# Patient Record
Sex: Female | Born: 1950
Health system: Southern US, Community
[De-identification: ages and names within clinical notes are randomized; demographics above are authoritative.]

## PROBLEM LIST (undated history)

## (undated) DIAGNOSIS — C801 Malignant (primary) neoplasm, unspecified: Secondary | ICD-10-CM

## (undated) DIAGNOSIS — E039 Hypothyroidism, unspecified: Secondary | ICD-10-CM

## (undated) DIAGNOSIS — I471 Supraventricular tachycardia, unspecified: Secondary | ICD-10-CM

## (undated) DIAGNOSIS — K649 Unspecified hemorrhoids: Secondary | ICD-10-CM

## (undated) DIAGNOSIS — C50919 Malignant neoplasm of unspecified site of unspecified female breast: Secondary | ICD-10-CM

## (undated) DIAGNOSIS — E785 Hyperlipidemia, unspecified: Secondary | ICD-10-CM

## (undated) DIAGNOSIS — M8580 Other specified disorders of bone density and structure, unspecified site: Secondary | ICD-10-CM

## (undated) DIAGNOSIS — Z1371 Encounter for nonprocreative screening for genetic disease carrier status: Secondary | ICD-10-CM

## (undated) DIAGNOSIS — Z8669 Personal history of other diseases of the nervous system and sense organs: Secondary | ICD-10-CM

## (undated) DIAGNOSIS — Z87891 Personal history of nicotine dependence: Secondary | ICD-10-CM

## (undated) DIAGNOSIS — R002 Palpitations: Secondary | ICD-10-CM

## (undated) DIAGNOSIS — C4490 Unspecified malignant neoplasm of skin, unspecified: Secondary | ICD-10-CM

## (undated) DIAGNOSIS — R232 Flushing: Secondary | ICD-10-CM

## (undated) DIAGNOSIS — I10 Essential (primary) hypertension: Secondary | ICD-10-CM

## (undated) DIAGNOSIS — D0511 Intraductal carcinoma in situ of right breast: Secondary | ICD-10-CM

## (undated) DIAGNOSIS — Z87440 Personal history of urinary (tract) infections: Secondary | ICD-10-CM

## (undated) HISTORY — DX: Intraductal carcinoma in situ of right breast: D05.11

## (undated) HISTORY — DX: Hypothyroidism, unspecified: E03.9

## (undated) HISTORY — DX: Personal history of urinary (tract) infections: Z87.440

## (undated) HISTORY — DX: Supraventricular tachycardia, unspecified: I47.10

## (undated) HISTORY — DX: Palpitations: R00.2

## (undated) HISTORY — DX: Malignant (primary) neoplasm, unspecified: C80.1

## (undated) HISTORY — DX: Personal history of other diseases of the nervous system and sense organs: Z86.69

## (undated) HISTORY — DX: Malignant neoplasm of unspecified site of unspecified female breast: C50.919

## (undated) HISTORY — DX: Unspecified hemorrhoids: K64.9

## (undated) HISTORY — DX: Hyperlipidemia, unspecified: E78.5

## (undated) HISTORY — DX: Other specified disorders of bone density and structure, unspecified site: M85.80

## (undated) HISTORY — DX: Personal history of nicotine dependence: Z87.891

## (undated) HISTORY — DX: Supraventricular tachycardia: I47.1

## (undated) HISTORY — DX: Encounter for nonprocreative screening for genetic disease carrier status: Z13.71

## (undated) HISTORY — DX: Essential (primary) hypertension: I10

## (undated) HISTORY — DX: Flushing: R23.2

## (undated) HISTORY — DX: Unspecified malignant neoplasm of skin, unspecified: C44.90

---

## 2004-08-07 DIAGNOSIS — C801 Malignant (primary) neoplasm, unspecified: Secondary | ICD-10-CM

## 2004-08-07 HISTORY — PX: MASTECTOMY: SHX3

## 2004-08-07 HISTORY — DX: Malignant (primary) neoplasm, unspecified: C80.1

## 2004-08-07 HISTORY — PX: BREAST BIOPSY: SHX20

## 2005-07-07 ENCOUNTER — Ambulatory Visit: Payer: Self-pay | Admitting: Oncology

## 2005-07-14 ENCOUNTER — Ambulatory Visit: Payer: Self-pay | Admitting: Oncology

## 2005-07-18 ENCOUNTER — Ambulatory Visit: Payer: Self-pay | Admitting: General Surgery

## 2005-08-03 ENCOUNTER — Ambulatory Visit: Payer: Self-pay | Admitting: General Surgery

## 2005-08-07 ENCOUNTER — Ambulatory Visit: Payer: Self-pay | Admitting: Oncology

## 2005-08-07 HISTORY — PX: COLONOSCOPY: SHX174

## 2005-09-07 ENCOUNTER — Ambulatory Visit: Payer: Self-pay | Admitting: Oncology

## 2005-10-05 ENCOUNTER — Ambulatory Visit: Payer: Self-pay | Admitting: Oncology

## 2005-11-05 ENCOUNTER — Ambulatory Visit: Payer: Self-pay | Admitting: Oncology

## 2005-12-05 ENCOUNTER — Ambulatory Visit: Payer: Self-pay | Admitting: Oncology

## 2006-01-05 ENCOUNTER — Ambulatory Visit: Payer: Self-pay | Admitting: Oncology

## 2006-02-04 ENCOUNTER — Ambulatory Visit: Payer: Self-pay | Admitting: Oncology

## 2006-03-07 ENCOUNTER — Ambulatory Visit: Payer: Self-pay | Admitting: Oncology

## 2006-04-07 ENCOUNTER — Ambulatory Visit: Payer: Self-pay | Admitting: Oncology

## 2006-04-30 ENCOUNTER — Other Ambulatory Visit: Payer: Self-pay

## 2006-04-30 ENCOUNTER — Emergency Department: Payer: Self-pay | Admitting: Emergency Medicine

## 2006-05-07 ENCOUNTER — Ambulatory Visit: Payer: Self-pay | Admitting: Oncology

## 2006-06-07 ENCOUNTER — Ambulatory Visit: Payer: Self-pay | Admitting: Oncology

## 2006-06-13 ENCOUNTER — Ambulatory Visit: Payer: Self-pay | Admitting: General Surgery

## 2006-07-07 ENCOUNTER — Ambulatory Visit: Payer: Self-pay | Admitting: Oncology

## 2006-08-07 ENCOUNTER — Ambulatory Visit: Payer: Self-pay | Admitting: Oncology

## 2006-09-07 ENCOUNTER — Ambulatory Visit: Payer: Self-pay | Admitting: Oncology

## 2006-09-11 ENCOUNTER — Emergency Department: Payer: Self-pay | Admitting: General Practice

## 2006-09-11 ENCOUNTER — Other Ambulatory Visit: Payer: Self-pay

## 2006-10-06 ENCOUNTER — Ambulatory Visit: Payer: Self-pay | Admitting: Oncology

## 2006-11-09 ENCOUNTER — Ambulatory Visit: Payer: Self-pay | Admitting: Oncology

## 2006-12-06 ENCOUNTER — Ambulatory Visit: Payer: Self-pay | Admitting: Oncology

## 2007-01-06 ENCOUNTER — Ambulatory Visit: Payer: Self-pay | Admitting: Oncology

## 2007-03-08 ENCOUNTER — Ambulatory Visit: Payer: Self-pay | Admitting: Oncology

## 2007-03-28 ENCOUNTER — Ambulatory Visit: Payer: Self-pay | Admitting: Oncology

## 2007-04-08 ENCOUNTER — Ambulatory Visit: Payer: Self-pay | Admitting: Oncology

## 2007-05-08 ENCOUNTER — Ambulatory Visit: Payer: Self-pay | Admitting: Oncology

## 2007-06-18 ENCOUNTER — Ambulatory Visit: Payer: Self-pay | Admitting: Oncology

## 2007-07-08 ENCOUNTER — Ambulatory Visit: Payer: Self-pay | Admitting: Oncology

## 2007-08-08 ENCOUNTER — Ambulatory Visit: Payer: Self-pay | Admitting: Oncology

## 2007-08-23 ENCOUNTER — Ambulatory Visit: Payer: Self-pay | Admitting: Oncology

## 2007-08-29 ENCOUNTER — Ambulatory Visit: Payer: Self-pay | Admitting: Gastroenterology

## 2007-09-08 ENCOUNTER — Ambulatory Visit: Payer: Self-pay | Admitting: Oncology

## 2007-10-06 ENCOUNTER — Ambulatory Visit: Payer: Self-pay | Admitting: Oncology

## 2007-10-22 ENCOUNTER — Ambulatory Visit: Payer: Self-pay | Admitting: Oncology

## 2007-11-06 ENCOUNTER — Ambulatory Visit: Payer: Self-pay | Admitting: Oncology

## 2007-12-06 ENCOUNTER — Ambulatory Visit: Payer: Self-pay | Admitting: Oncology

## 2008-01-06 ENCOUNTER — Ambulatory Visit: Payer: Self-pay | Admitting: Oncology

## 2008-01-07 ENCOUNTER — Ambulatory Visit: Payer: Self-pay | Admitting: Internal Medicine

## 2008-04-07 ENCOUNTER — Ambulatory Visit: Payer: Self-pay | Admitting: Oncology

## 2008-04-27 ENCOUNTER — Ambulatory Visit: Payer: Self-pay | Admitting: Oncology

## 2008-05-07 ENCOUNTER — Ambulatory Visit: Payer: Self-pay | Admitting: Oncology

## 2008-06-22 ENCOUNTER — Ambulatory Visit: Payer: Self-pay | Admitting: Oncology

## 2008-08-07 ENCOUNTER — Ambulatory Visit: Payer: Self-pay | Admitting: Oncology

## 2008-08-24 ENCOUNTER — Ambulatory Visit: Payer: Self-pay | Admitting: Oncology

## 2008-09-07 ENCOUNTER — Ambulatory Visit: Payer: Self-pay | Admitting: Oncology

## 2009-02-04 ENCOUNTER — Ambulatory Visit: Payer: Self-pay | Admitting: Oncology

## 2009-02-22 ENCOUNTER — Ambulatory Visit: Payer: Self-pay | Admitting: Oncology

## 2009-03-07 ENCOUNTER — Ambulatory Visit: Payer: Self-pay | Admitting: Oncology

## 2009-06-23 ENCOUNTER — Ambulatory Visit: Payer: Self-pay | Admitting: General Surgery

## 2009-08-07 ENCOUNTER — Ambulatory Visit: Payer: Self-pay | Admitting: Oncology

## 2009-08-25 ENCOUNTER — Ambulatory Visit: Payer: Self-pay | Admitting: Oncology

## 2009-09-07 ENCOUNTER — Ambulatory Visit: Payer: Self-pay | Admitting: Oncology

## 2010-02-04 ENCOUNTER — Ambulatory Visit: Payer: Self-pay | Admitting: Oncology

## 2010-02-23 ENCOUNTER — Ambulatory Visit: Payer: Self-pay | Admitting: Oncology

## 2010-03-07 ENCOUNTER — Ambulatory Visit: Payer: Self-pay | Admitting: Oncology

## 2010-06-28 ENCOUNTER — Ambulatory Visit: Payer: Self-pay | Admitting: Oncology

## 2010-08-26 ENCOUNTER — Ambulatory Visit: Payer: Self-pay | Admitting: Oncology

## 2010-09-07 ENCOUNTER — Ambulatory Visit: Payer: Self-pay | Admitting: Oncology

## 2011-07-04 ENCOUNTER — Ambulatory Visit: Payer: Self-pay | Admitting: Oncology

## 2011-07-27 ENCOUNTER — Ambulatory Visit: Payer: Self-pay | Admitting: Oncology

## 2011-08-08 ENCOUNTER — Ambulatory Visit: Payer: Self-pay | Admitting: Oncology

## 2012-07-08 ENCOUNTER — Ambulatory Visit: Payer: Self-pay | Admitting: Oncology

## 2012-07-25 ENCOUNTER — Ambulatory Visit: Payer: Self-pay | Admitting: Oncology

## 2012-08-07 ENCOUNTER — Ambulatory Visit: Payer: Self-pay | Admitting: Oncology

## 2012-11-26 ENCOUNTER — Ambulatory Visit: Payer: Self-pay | Admitting: Cardiovascular Disease

## 2012-12-06 ENCOUNTER — Ambulatory Visit: Payer: Self-pay | Admitting: Cardiovascular Disease

## 2012-12-31 ENCOUNTER — Encounter: Payer: Self-pay | Admitting: Cardiovascular Disease

## 2012-12-31 ENCOUNTER — Ambulatory Visit (INDEPENDENT_AMBULATORY_CARE_PROVIDER_SITE_OTHER): Payer: BC Managed Care – PPO | Admitting: Cardiovascular Disease

## 2012-12-31 VITALS — BP 148/92 | HR 61 | Ht 68.0 in | Wt 169.0 lb

## 2012-12-31 DIAGNOSIS — F411 Generalized anxiety disorder: Secondary | ICD-10-CM

## 2012-12-31 DIAGNOSIS — F39 Unspecified mood [affective] disorder: Secondary | ICD-10-CM | POA: Insufficient documentation

## 2012-12-31 DIAGNOSIS — I471 Supraventricular tachycardia, unspecified: Secondary | ICD-10-CM

## 2012-12-31 DIAGNOSIS — E039 Hypothyroidism, unspecified: Secondary | ICD-10-CM

## 2012-12-31 DIAGNOSIS — E785 Hyperlipidemia, unspecified: Secondary | ICD-10-CM

## 2012-12-31 DIAGNOSIS — R002 Palpitations: Secondary | ICD-10-CM

## 2012-12-31 DIAGNOSIS — F419 Anxiety disorder, unspecified: Secondary | ICD-10-CM

## 2012-12-31 HISTORY — DX: Supraventricular tachycardia, unspecified: I47.10

## 2012-12-31 NOTE — Addendum Note (Signed)
Addended by: Antonieta Iba on: 12/31/2012 05:57 PM   Modules accepted: Level of Service

## 2012-12-31 NOTE — Progress Notes (Signed)
   Patient ID: Adriana Cobb, female    DOB: 1951/06/24, 62 y.o.   MRN: [id number]  HPI Comments: Adriana Cobb is a very pleasant 62 year old woman with history of breast cancer on the left, mastectomy, chemotherapy, radiation treatments in 2006, history of SVT with 2 episodes in 2008 treated with adenosine, initially started on amiodarone and changed to sotalol. She presents to establish care in our office. Also has a history of hyperlipidemia.  Months overall, she has had no further episodes of SVT since 2008. She is nervous about coming off the medication. She is active, no chest pain, no shortness of breath with activity. Overall has no complaints.   No recent lab work. Prior cholesterol 2008 shows total cholesterol 241, LDL 158, prior to starting simvastatin  Studies done at [facility] Echocardiogram in 2008 was essentially normal with mild MR Echocardiogram May 2009 documented moderate pulmonic regurgitation and moderate MR Echocardiogram November 2009 showed mild to moderate MR Stress test June 2000 and shows no ischemia, she exercised on treadmill for 8 minutes 30 seconds achieved 10 METS  EKG today shows normal sinus rhythm with rate 61 beats per minute, no significant ST or T wave changes       BP 148/92  Pulse 61  Ht 5\' 8"  (1.727 m)  Wt 169 lb (76.658 kg)  BMI 25.7 kg/m2  Review of Systems  Constitutional: Negative.   HENT: Negative.   Eyes: Negative.   Respiratory: Negative.   Cardiovascular: Negative.   Gastrointestinal: Negative.   Musculoskeletal: Negative.   Skin: Negative.   Neurological: Negative.   Psychiatric/Behavioral: The patient is nervous/anxious.   All other systems reviewed and are negative.    BP 148/92  Pulse 61  Ht 5\' 8"  (1.727 m)  Wt 169 lb (76.658 kg)  BMI 25.7 kg/m2  Physical Exam  Nursing note and vitals reviewed. Constitutional: She is oriented to person, place, and time. She appears well-developed and well-nourished.  HENT:   Head: Normocephalic.  Nose: Nose normal.  Mouth/Throat: Oropharynx is clear and moist.  Eyes: Conjunctivae are normal. Pupils are equal, round, and reactive to light.  Neck: Normal range of motion. Neck supple. No JVD present.  Cardiovascular: Normal rate, regular rhythm, S1 normal, S2 normal, normal heart sounds and intact distal pulses.  Exam reveals no gallop and no friction rub.   No murmur heard. Pulmonary/Chest: Effort normal and breath sounds normal. No respiratory distress. She has no wheezes. She has no rales. She exhibits no tenderness.  Abdominal: Soft. Bowel sounds are normal. She exhibits no distension. There is no tenderness.  Musculoskeletal: Normal range of motion. She exhibits no edema and no tenderness.  Lymphadenopathy:    She has no cervical adenopathy.  Neurological: She is alert and oriented to person, place, and time. Coordination normal.  Skin: Skin is warm and dry. No rash noted. No erythema.  Psychiatric: She has a normal mood and affect. Her behavior is normal. Judgment and thought content normal.    Assessment and Plan

## 2012-12-31 NOTE — Assessment & Plan Note (Signed)
We'll try to obtain most recent lipid panel from her primary care physician

## 2012-12-31 NOTE — Patient Instructions (Addendum)
You are doing well. No medication changes were made.  Ok to wean off sotolol if you would like for SVT Call the office for episodes of tachycardia  Please call us if you have new issues that need to be addressed before your next appt.  Your physician wants you to follow-up in: 12 months.  You will receive a reminder letter in the mail two months in advance. If you don't receive a letter, please call our office to schedule the follow-up appointment.

## 2012-12-31 NOTE — Assessment & Plan Note (Signed)
No further episodes since 2008. She is uncertain whether she would like to wean off sotalol. She feels that she would be more anxious without medication. We will leave this up to her whether she would like to wean the medication slowly, decreasing the dose to 40 mg twice a day or stay on the medication. No further testing needed at this time. We did discuss ablation. Also discussed the need for adenosine if she has additional episodes.

## 2013-01-31 ENCOUNTER — Encounter: Payer: Self-pay | Admitting: *Deleted

## 2013-01-31 DIAGNOSIS — C801 Malignant (primary) neoplasm, unspecified: Secondary | ICD-10-CM | POA: Insufficient documentation

## 2013-05-21 ENCOUNTER — Other Ambulatory Visit: Payer: Self-pay | Admitting: *Deleted

## 2013-05-21 MED ORDER — SOTALOL HCL (AF) 80 MG PO TABS: 80.0000 mg | ORAL_TABLET | Freq: Two times a day (BID) | ORAL | Status: DC

## 2013-05-21 NOTE — Telephone Encounter (Signed)
Requested Prescriptions   Signed Prescriptions Disp Refills  . SOTALOL AF 80 MG TABS 60 each 3    Sig: Take 1 tablet (80 mg total) by mouth 2 (two) times daily.    Authorizing Provider: Antonieta Iba    Ordering User: Kendrick Fries

## 2013-07-09 ENCOUNTER — Ambulatory Visit: Payer: Self-pay | Admitting: Oncology

## 2013-07-18 ENCOUNTER — Encounter: Payer: Self-pay | Admitting: General Surgery

## 2013-07-18 ENCOUNTER — Ambulatory Visit: Payer: Self-pay | Admitting: Oncology

## 2013-07-18 ENCOUNTER — Ambulatory Visit (INDEPENDENT_AMBULATORY_CARE_PROVIDER_SITE_OTHER): Payer: BC Managed Care – PPO | Admitting: General Surgery

## 2013-07-18 VITALS — BP 128/78 | HR 76 | Resp 12 | Ht 68.0 in | Wt 170.0 lb

## 2013-07-18 DIAGNOSIS — Z853 Personal history of malignant neoplasm of breast: Secondary | ICD-10-CM

## 2013-07-18 DIAGNOSIS — C801 Malignant (primary) neoplasm, unspecified: Secondary | ICD-10-CM

## 2013-07-18 DIAGNOSIS — R928 Other abnormal and inconclusive findings on diagnostic imaging of breast: Secondary | ICD-10-CM

## 2013-07-18 NOTE — Patient Instructions (Signed)
Stereotactic Breast Biopsy   A stereotactic breast biopsy is a procedure in which mammography is used in the collection of a sample of breast tissue. Mammography is a type of X-ray exam of the breasts that produces an image called a mammogram. The mammogram allows your health care provider to precisely locate the area of the breast from which a tissue sample will be taken. The tissue is then examined under a microscope to see if cancerous cells are present. A breast biopsy is done when:   · A lump, abnormality, or mass is seen in the breast on a breast X-ray (mammogram).    · Small calcium deposits (calcifications) are seen in the breast.    · The shape or appearance of the breasts changes.    · The shape or appearance of the nipples changes. You may have unusual or bloody discharge coming from the nipples, or you may have crusting, retraction, or dimpling of the nipples.  A breast biopsy can indicate if you need surgery or other treatment.    LET YOUR HEALTH CARE PROVIDER KNOW ABOUT:  · Any allergies you have.  · All medicines you are taking, including vitamins, herbs, eye drops, creams, and over-the-counter medicines.  · Previous problems you or members of your family have had with the use of anesthetics.  · Any blood disorders you have.  · Previous surgeries you have had.  · Medical conditions you have.  RISKS AND COMPLICATIONS  Generally, stereotactic breast biopsy is a safe procedure. However, as with any procedure, complications can occur. Possible complications include:  · Infection at the needle-insertion site.    · Bleeding or bruising after surgery.  · The breast may become altered or deformed as a result of the procedure.  · The needle may go through the chest wall into the lung area.    BEFORE THE PROCEDURE  · Wear a supportive bra to the procedure.  · You will be asked to remove jewelry, dentures, eyeglasses, metal objects, or clothing that might interfere with the X-ray images. You may want to leave  some of these objects at home.  · Arrange for someone to drive you home after the procedure if desired.  PROCEDURE   A stereotactic breast biopsy is done while you are awake. During the procedure, relax as much as possible. Let your health care provider know if you are uncomfortable, anxious, or in pain. Usually, the only discomfort felt during the procedure is caused by staying in one position for the length of the procedure. This discomfort can be reduced by carefully placed cushions.  Most of the time the biopsy is done using a table with openings on it. You will be asked to lie facedown on the table and place your breasts through the openings. Your breast is compressed between metal plates to get good X-ray images. Your skin will be cleaned, and a numbing medicine (local anesthetic) will be injected. A small cut (incision) will be made in your breast. The tip of the biopsy needle will be directed through the incision. Several small pieces of suspicious tissue will be taken. Then, a final set of X-ray images will be obtained. If they show that the suspicious tissue has been mostly or completely removed, a small clip will be left at the biopsy site. This is done so that the biopsy site can be easily located if the results of the biopsy show that the tissue is cancerous.   After the procedure, the incision will be stitched (sutured) or taped and covered   with a bandage (dressing). Your health care provider may apply a pressure dressing and an ice pack to prevent bleeding and swelling in the breast.   A stereotactic breast biopsy can take 30 minutes or more.  AFTER THE PROCEDURE   If you are doing well and have no problems, you will be allowed to go home.   Document Released: 04/22/2003 Document Revised: 03/26/2013 Document Reviewed: 02/20/2013  Exit[employer]® Patient Information ©2014 [employer].

## 2013-07-18 NOTE — Progress Notes (Signed)
Patient ID: Adriana Cobb, female   DOB: 10/01/1950, 62 y.o.   MRN: [id number]  Chief Complaint  Patient presents with  . Follow-up    mammogram    HPI Adriana Cobb is a 62 y.o. female  who presents for a breast evaluation. The most recent mammogram was done at [facility] on 07/09/13 with added views done on 07/18/13.  Patient does perform regular self breast checks and gets regular mammograms done. No complaints. Patient has a history of Left breast cancer that was treated with chemotherapy and radiation, and a left modified radical mastectomy in 2007.   HPI  Past Medical History  Diagnosis Date  . Unspecified essential hypertension   . Unspecified hypothyroidism   . Hyperlipidemia   . Palpitations   . Paroxysmal supraventricular tachycardia   . History of breast cancer     left breast   . Personal history of tobacco use, presenting hazards to health   . H/O cystitis   . Cancer 2006    left breast, treated with Radiation and Chemo  . Hemorrhoids     Past Surgical History  Procedure Laterality Date  . Mastectomy Left 2006    left breast  . Breast biopsy Left 2006    stereo breast bx  . Colonoscopy  2007    Dr. Servando Snare  . Cesarean section  1987, 1988    x 2    Family History  Problem Relation Age of Onset  . Cancer Other     breast cancer    Social History History  Substance Use Topics  . Smoking status: Former Smoker -- 1.00 packs/day for 10 years    Types: Cigarettes    Quit date: 09/18/1978  . Smokeless tobacco: Not on file  . Alcohol Use: Yes     Comment: bottle a wine weekly    Allergies  Allergen Reactions  . Enalapril     Cough     Current Outpatient Prescriptions  Medication Sig Dispense Refill  . citalopram (CELEXA) 20 MG tablet Take 20 mg by mouth daily.      Marland Kitchen levothyroxine (SYNTHROID, LEVOTHROID) 50 MCG tablet Take 50 mcg by mouth daily before breakfast.      . simvastatin (ZOCOR) 40 MG tablet Take 40 mg by mouth every evening.      Marland Kitchen  SOTALOL AF 80 MG TABS Take 1 tablet (80 mg total) by mouth 2 (two) times daily.  180 each  3   No current facility-administered medications for this visit.    Review of Systems Review of Systems  Constitutional: Negative.   Respiratory: Negative.   Cardiovascular: Negative.     Blood pressure 128/78, pulse 76, resp. rate 12, height 5\' 8"  (1.727 m), weight 170 lb (77.111 kg).  Physical Exam Physical Exam  Constitutional: She is oriented to person, place, and time. She appears well-developed and well-nourished.  Eyes: No scleral icterus.  Neck: No mass and no thyromegaly present.  Cardiovascular: Normal rate, regular rhythm and normal heart sounds.   Pulmonary/Chest: Breath sounds normal. Right breast exhibits no inverted nipple, no mass, no nipple discharge, no skin change and no tenderness.  Left Mastectomy site well healed, incision looks clean. No signs of local recurrence.  Abdominal: Soft. Bowel sounds are normal. There is no tenderness.  Lymphadenopathy:    She has no cervical adenopathy.    She has no axillary adenopathy.  Neurological: She is alert and oriented to person, place, and time.  Skin: Skin is warm and  dry.     Assessment    Recent right mammogram shows a cluster of microcalcifications -needs stereotactic biopsy. Pt is familiar with this procedure and is agreeable. Exam otherwise is stable.    Plan    Patient to have a right breast stereo biopsy.       Andjela Wickes G 07/18/2013, 1:35 PM

## 2013-07-21 ENCOUNTER — Other Ambulatory Visit: Payer: Self-pay | Admitting: *Deleted

## 2013-07-21 DIAGNOSIS — R920 Mammographic microcalcification found on diagnostic imaging of breast: Secondary | ICD-10-CM

## 2013-07-21 DIAGNOSIS — Z853 Personal history of malignant neoplasm of breast: Secondary | ICD-10-CM

## 2013-07-21 NOTE — Progress Notes (Signed)
Patient has been scheduled for a right breast stereotactic biopsy at [facility] for 08-11-13 at 1 pm. She will check-in at the [facility] at 12:30 pm. This patient is aware of date, time, and instructions. Patient verbalizes understanding.

## 2013-07-22 ENCOUNTER — Encounter: Payer: Self-pay | Admitting: *Deleted

## 2013-07-24 ENCOUNTER — Ambulatory Visit: Payer: Self-pay | Admitting: General Surgery

## 2013-07-29 ENCOUNTER — Ambulatory Visit: Payer: Self-pay | Admitting: Oncology

## 2013-08-07 ENCOUNTER — Ambulatory Visit: Payer: Self-pay | Admitting: Oncology

## 2013-08-07 HISTORY — PX: MASTECTOMY: SHX3

## 2013-08-11 ENCOUNTER — Ambulatory Visit: Payer: Self-pay | Admitting: General Surgery

## 2013-08-11 DIAGNOSIS — R920 Mammographic microcalcification found on diagnostic imaging of breast: Secondary | ICD-10-CM

## 2013-08-13 ENCOUNTER — Encounter: Payer: Self-pay | Admitting: General Surgery

## 2013-08-13 ENCOUNTER — Telehealth: Payer: Self-pay | Admitting: General Surgery

## 2013-08-13 NOTE — Telephone Encounter (Signed)
Patient's surgery has been scheduled for 08-25-13 at [facility]. We will review all instructions with this patient at office visit per her request.   Dr. Jamal Collin will need to complete orders and update history and physical for pre-admit appointment scheduled for 08-20-13 at 3 pm.

## 2013-08-13 NOTE — Telephone Encounter (Signed)
Path report from right breast stereotactic biopsy-DCIS. Pt advised on this. Pt is familiar with aspects of breast cancer treatment-having had left mastectomy already.. She is very much inclined to have a right mastectomy.  I feel this is reasonable. Pt also is not interested in reconstruction. Will plan for right total mastectomy, sn biopsy.

## 2013-08-15 ENCOUNTER — Encounter: Payer: Self-pay | Admitting: General Surgery

## 2013-08-18 ENCOUNTER — Encounter: Payer: Self-pay | Admitting: General Surgery

## 2013-08-18 ENCOUNTER — Ambulatory Visit (INDEPENDENT_AMBULATORY_CARE_PROVIDER_SITE_OTHER): Payer: BC Managed Care – PPO | Admitting: General Surgery

## 2013-08-18 VITALS — BP 132/78 | HR 72 | Resp 14 | Ht 68.0 in | Wt 170.0 lb

## 2013-08-18 DIAGNOSIS — D0590 Unspecified type of carcinoma in situ of unspecified breast: Secondary | ICD-10-CM

## 2013-08-18 DIAGNOSIS — D0511 Intraductal carcinoma in situ of right breast: Secondary | ICD-10-CM

## 2013-08-18 NOTE — Progress Notes (Signed)
Patient ID: Adriana Cobb, female   DOB: 1951-03-01, 62 y.o.   MRN: [id number]  Chief Complaint  Patient presents with  . Pre-op Exam    right mastectomy    HPI Adriana Cobb is a 63 y.o. female. Here for pre op right mastectomy with sentinel node biopsy scheduled for 08/25/13. She reports the biopsy site is healing well. She reports no changes since last seen. Path report on stereo biopsy showed DCIS. Pt was advised by phone earlier and she decided to have mastectomy. She is not interested in reconstruction. ER/PR will be done on excised specimen.  HPI  Past Medical History  Diagnosis Date  . Unspecified essential hypertension   . Unspecified hypothyroidism   . Hyperlipidemia   . Palpitations   . Paroxysmal supraventricular tachycardia   . History of breast cancer     left breast   . Personal history of tobacco use, presenting hazards to health   . H/O cystitis   . Cancer 2006    left breast, treated with Radiation and Chemo  . Hemorrhoids     Past Surgical History  Procedure Laterality Date  . Mastectomy Left 2006    left breast  . Breast biopsy Left 2006    stereo breast bx  . Colonoscopy  2007    Dr. Allen Norris  . Cesarean section  1987, 1988    x 2    Family History  Problem Relation Age of Onset  . Cancer Other     breast cancer    Social History History  Substance Use Topics  . Smoking status: Former Smoker -- 1.00 packs/day for 10 years    Types: Cigarettes    Quit date: 09/18/1978  . Smokeless tobacco: Not on file  . Alcohol Use: Yes     Comment: bottle a wine weekly    Allergies  Allergen Reactions  . Enalapril     Cough     Current Outpatient Prescriptions  Medication Sig Dispense Refill  . citalopram (CELEXA) 20 MG tablet Take 20 mg by mouth daily.      Marland Kitchen levothyroxine (SYNTHROID, LEVOTHROID) 50 MCG tablet Take 50 mcg by mouth daily before breakfast.      . simvastatin (ZOCOR) 40 MG tablet Take 40 mg by mouth every evening.      Marland Kitchen SOTALOL  AF 80 MG TABS Take 1 tablet (80 mg total) by mouth 2 (two) times daily.  180 each  3   No current facility-administered medications for this visit.    Review of Systems Review of Systems  Constitutional: Negative.   Respiratory: Negative.   Cardiovascular: Negative.     Blood pressure 132/78, pulse 72, resp. rate 14, height 5\' 8"  (1.727 m), weight 170 lb (77.111 kg).  Physical Exam Physical Exam  Constitutional: She is oriented to person, place, and time. She appears well-developed and well-nourished.  Neck: Neck supple. No tracheal deviation present. No thyromegaly present.  Cardiovascular: Normal rate, regular rhythm and normal heart sounds.   Pulmonary/Chest: Effort normal and breath sounds normal.  Left mastectomy site well healed, no sign of local recurrence. Right breast biopsy site is healing well.  Abdominal: Soft. There is no hepatosplenomegaly. There is no tenderness.  Lymphadenopathy:    She has no cervical adenopathy.    She has no axillary adenopathy.  Neurological: She is alert and oriented to person, place, and time.    Data Reviewed Path report.  Assessment    DCIS right breast, prior left mastectomy  for stage 2 CA.      Plan Explained mastectomy procedure. Also plan for SN biopsy in case there is invasive component in the excised breast. Pt is agreeable.             SANKAR,SEEPLAPUTHUR G 08/19/2013, 9:18 AM

## 2013-08-19 ENCOUNTER — Encounter: Payer: Self-pay | Admitting: General Surgery

## 2013-08-20 ENCOUNTER — Ambulatory Visit: Payer: Self-pay | Admitting: General Surgery

## 2013-08-25 ENCOUNTER — Ambulatory Visit: Payer: Self-pay | Admitting: General Surgery

## 2013-08-25 ENCOUNTER — Encounter: Payer: Self-pay | Admitting: General Surgery

## 2013-08-25 DIAGNOSIS — D0590 Unspecified type of carcinoma in situ of unspecified breast: Secondary | ICD-10-CM

## 2013-08-26 ENCOUNTER — Encounter: Payer: Self-pay | Admitting: General Surgery

## 2013-08-28 ENCOUNTER — Encounter: Payer: Self-pay | Admitting: General Surgery

## 2013-08-28 ENCOUNTER — Ambulatory Visit: Payer: BC Managed Care – PPO | Admitting: *Deleted

## 2013-08-28 DIAGNOSIS — Z853 Personal history of malignant neoplasm of breast: Secondary | ICD-10-CM

## 2013-08-28 DIAGNOSIS — C50919 Malignant neoplasm of unspecified site of unspecified female breast: Secondary | ICD-10-CM

## 2013-08-28 LAB — PATHOLOGY REPORT

## 2013-08-28 NOTE — Progress Notes (Signed)
She is post op right mastectomy here for a wound check. Wound clean no signs of infection. Aware she may use heating pad for comfort and may use Aleve BiD. Drainage average 40-50 daily.

## 2013-08-28 NOTE — Patient Instructions (Signed)
Follow up as scheduled.  Continue JP drain record

## 2013-09-01 LAB — PATHOLOGY REPORT

## 2013-09-02 ENCOUNTER — Encounter: Payer: Self-pay | Admitting: General Surgery

## 2013-09-04 ENCOUNTER — Encounter: Payer: Self-pay | Admitting: General Surgery

## 2013-09-04 ENCOUNTER — Ambulatory Visit (INDEPENDENT_AMBULATORY_CARE_PROVIDER_SITE_OTHER): Payer: BC Managed Care – PPO | Admitting: General Surgery

## 2013-09-04 VITALS — BP 118/72 | HR 78 | Resp 12 | Ht 68.0 in | Wt 171.0 lb

## 2013-09-04 DIAGNOSIS — D0510 Intraductal carcinoma in situ of unspecified breast: Secondary | ICD-10-CM | POA: Insufficient documentation

## 2013-09-04 DIAGNOSIS — Z853 Personal history of malignant neoplasm of breast: Secondary | ICD-10-CM

## 2013-09-04 DIAGNOSIS — D0590 Unspecified type of carcinoma in situ of unspecified breast: Secondary | ICD-10-CM

## 2013-09-04 HISTORY — DX: Intraductal carcinoma in situ of unspecified breast: D05.10

## 2013-09-04 NOTE — Progress Notes (Signed)
This is a 63 year old female here today for her post op right breast mastectomy done on 08/25/13. Patient states she [place] doing well. She reports that she had about 13cc from her drain yesterday. Today she reports about 0.5 cc total drainage today. She says the pain is much better.   Drainage has been less than 30cc for 2 days. Drain was removed. Incision is clean and healing well, no seroma identified. Pathology DCIS, SN negative. ER/PR both positive. Patient advised.  Follow up in two weeks.

## 2013-09-04 NOTE — Patient Instructions (Addendum)
Tamoxifen oral solution What is this medicine? TAMOXIFEN (ta MOX i fen) blocks the effects of estrogen. It is commonly used to treat breast cancer. It is also used to decrease the chance of breast cancer coming back in women who have received treatment for the disease. It may also help prevent breast cancer in women who have a high risk of developing breast cancer. This medicine may be used for other purposes; ask your health care provider or pharmacist if you have questions. COMMON BRAND NAME(S): Soltamox What should I tell my health care provider before I take this medicine? They need to know if you have any of these conditions: -blood clots -blood disease -cataracts or impaired eyesight -endometriosis -high calcium levels -high cholesterol -irregular menstrual cycles -liver disease -stroke -uterine fibroids -an unusual or allergic reaction to tamoxifen, other medicines, foods, dyes, or preservatives -pregnant or trying to get pregnant -breast-feeding How should I use this medicine? Take this medicine by mouth with a glass of water. Follow the directions on the prescription label. You can take it with or without food. Take your medicine at regular intervals. Do not take your medicine more often than directed. Do not stop taking except on your doctor's advice. A special MedGuide will be given to you by the pharmacist with each prescription and refill. Be sure to read this information carefully each time. Talk to your pediatrician regarding the use of this medicine in children. While this drug may be prescribed for selected conditions, precautions do apply. Overdosage: If you think you have taken too much of this medicine contact a poison control center or emergency room at once. NOTE: This medicine is only for you. Do not share this medicine with others. What if I miss a dose? If you miss a dose, take it as soon as you can. If it is almost time for your next dose, take only that dose. Do  not take double or extra doses. What may interact with this medicine? -aminoglutethimide -bromocriptine -chemotherapy drugs -female hormones, like estrogens and birth control pills -letrozole -medroxyprogesterone -phenobarbital -rifampin -warfarin This list may not describe all possible interactions. Give your health care provider a list of all the medicines, herbs, non-prescription drugs, or dietary supplements you use. Also tell them if you smoke, drink alcohol, or use illegal drugs. Some items may interact with your medicine. What should I watch for while using this medicine? Visit your doctor or health care professional for regular checks on your progress. You will need regular pelvic exams, breast exams, and mammograms. If you are taking this medicine to reduce your risk of getting breast cancer, you should know that this medicine does not prevent all types of breast cancer. If breast cancer or other problems occur, there is no guarantee that it will be found at an early stage. Do not become pregnant while taking this medicine or for 2 months after stopping this medicine. Stop taking this medicine if you get pregnant or think you are pregnant and contact your doctor. This medicine may harm your unborn baby. Women who can possibly become pregnant should use birth control methods that do not use hormones during tamoxifen treatment and for 2 months after therapy has stopped. Talk with your health care provider for birth control advice. Do not breast feed while taking this medicine. What side effects may I notice from receiving this medicine? Side effects that you should report to your doctor or health care professional as soon as possible: -allergic reactions like skin rash, itching  or hives, swelling of the face, lips, or tongue -breathing problems -changes in vision -changes in your menstrual cycle -difficulty walking or talking -new breast lumps -numbness -pelvic pain or  pressure -redness, blistering, peeling or loosening of the skin, including inside the mouth -sudden chest pain -swelling, pain or tenderness in your calf or leg -unusual bruising or bleeding -vaginal discharge that is bloody, brown, or rust -weakness -yellowing of the whites of the eyes or skin Side effects that usually do not require medical attention (report to your doctor or health care professional if they continue or are bothersome): -fatigue -hair loss, although uncommon and is usually mild -headache -hot flashes -impotence (in men) -nausea, vomiting (mild) -vaginal discharge (white or clear) This list may not describe all possible side effects. Call your doctor for medical advice about side effects. You may report side effects to FDA at 1-800-FDA-1088. Where should I keep my medicine? Keep out of the reach of children. Store in the original package at room temperature between 20 and 25 degrees C (68 and 77 degrees F). Do not store above 25 degrees C (77 degrees F). DO NOT freeze or refrigerate. Protect from light. Keep container tightly closed. Use within 3 months of opening. Throw away any unused medicine after the expiration date. NOTE: This sheet is a summary. It may not cover all possible information. If you have questions about this medicine, talk to your doctor, pharmacist, or health care provider.  2014, Elsevier/Gold Standard. (2008-04-20 15:48:08)  

## 2013-09-18 ENCOUNTER — Ambulatory Visit (INDEPENDENT_AMBULATORY_CARE_PROVIDER_SITE_OTHER): Payer: BC Managed Care – PPO | Admitting: General Surgery

## 2013-09-18 ENCOUNTER — Encounter: Payer: Self-pay | Admitting: General Surgery

## 2013-09-18 VITALS — BP 144/82 | HR 78 | Resp 12 | Ht 68.0 in | Wt 168.0 lb

## 2013-09-18 DIAGNOSIS — D0510 Intraductal carcinoma in situ of unspecified breast: Secondary | ICD-10-CM

## 2013-09-18 DIAGNOSIS — D0590 Unspecified type of carcinoma in situ of unspecified breast: Secondary | ICD-10-CM

## 2013-09-18 NOTE — Patient Instructions (Signed)
Patient to return in 4 months for follow up.

## 2013-09-18 NOTE — Progress Notes (Signed)
Patient ID: Adriana Cobb, female   DOB: Aug 16, 1950, 63 y.o.   MRN: [id number]   The patient presents for a post op right mastectomy evaluation. The procedure was performed on 08/25/13. No new problems at this time.   Right mastectomy site  healing well. No signs of infection.   Pt had DCIS ER/PR pos. Likely will benefit with antihormonal therapy. Her left breast cancer was hormone negative. Will discuss with Dr. Oliva Bustard regarding this and pt will be advised.  RTC in 4 mos.

## 2013-09-19 ENCOUNTER — Encounter: Payer: Self-pay | Admitting: General Surgery

## 2013-12-31 ENCOUNTER — Encounter: Payer: Self-pay | Admitting: Cardiovascular Disease

## 2013-12-31 ENCOUNTER — Ambulatory Visit (INDEPENDENT_AMBULATORY_CARE_PROVIDER_SITE_OTHER): Payer: BC Managed Care – PPO | Admitting: Cardiovascular Disease

## 2013-12-31 VITALS — BP 130/80 | HR 65 | Ht 68.0 in | Wt 163.2 lb

## 2013-12-31 DIAGNOSIS — E785 Hyperlipidemia, unspecified: Secondary | ICD-10-CM

## 2013-12-31 DIAGNOSIS — I471 Supraventricular tachycardia: Secondary | ICD-10-CM

## 2013-12-31 DIAGNOSIS — E039 Hypothyroidism, unspecified: Secondary | ICD-10-CM

## 2013-12-31 NOTE — Assessment & Plan Note (Signed)
No recent episodes of SVT. Recommended she cut her sotalol down to 40 mg twice a day. She would like to wean down on the dose of her medication

## 2013-12-31 NOTE — Patient Instructions (Signed)
You are doing well. Ok to cut the sotolol in 1/2 twice a day Take extra sotolol as needed for arrhythmia  Please call us if you have new issues that need to be addressed before your next appt.  Your physician wants you to follow-up in: 12 months.  You will receive a reminder letter in the mail two months in advance. If you don't receive a letter, please call our office to schedule the follow-up appointment.

## 2013-12-31 NOTE — Progress Notes (Signed)
Patient ID: Adriana Cobb, female    DOB: 1951/06/30, 63 y.o.   MRN: [id number]  HPI Comments: Adriana Cobb is a very pleasant 63 year old woman with history of breast cancer on the left, mastectomy, chemotherapy, radiation treatments in 2006, history of SVT with 2 episodes in 2008 treated with adenosine, initially started on amiodarone and changed to sotalol.  history of hyperlipidemia. She presents for routine followup  Since her last clinic visit, she has had mastectomy on the right for recurrent cancer. Sentinel node was negative. She has recovered relatively well and considering reconstruction surgery.  She denies having any SVT episodes since 2008. She would like to decrease the dose of her sotalol.  She is active, no chest pain, no shortness of breath with activity. Overall has no complaints.   Prior cholesterol 2008 shows total cholesterol 241, LDL 158, prior to starting simvastatin Most recent lab work showing total cholesterol 155, LDL 69, HDL 66  Studies done at [facility] Echocardiogram in 2008 was essentially normal with mild MR Echocardiogram May 2009 documented moderate pulmonic regurgitation and moderate MR Echocardiogram November 2009 showed mild to moderate MR Stress test June 2000 and shows no ischemia, she exercised on treadmill for 8 minutes 30 seconds achieved 10 METS  EKG today shows normal sinus rhythm with rate 65 beats per minute, no significant ST or T wave changes      Outpatient Encounter Prescriptions as of 12/31/2013  Medication Sig  . Cholecalciferol (VITAMIN D PO) Take 1 tablet by mouth daily.  . citalopram (CELEXA) 20 MG tablet Take 20 mg by mouth daily.  Marland Kitchen levothyroxine (SYNTHROID, LEVOTHROID) 50 MCG tablet Take 50 mcg by mouth daily before breakfast.  . Multiple Vitamin (MULTIVITAMIN) tablet Take 1 tablet by mouth daily.  . simvastatin (ZOCOR) 40 MG tablet Take 40 mg by mouth every evening.  Marland Kitchen SOTALOL AF 80 MG TABS Take 1 tablet (80 mg total)  by mouth 2 (two) times daily.    Review of Systems  Constitutional: Negative.   HENT: Negative.   Eyes: Negative.   Respiratory: Negative.   Cardiovascular: Negative.   Gastrointestinal: Negative.   Endocrine: Negative.   Musculoskeletal: Negative.   Skin: Negative.   Allergic/Immunologic: Negative.   Neurological: Negative.   Hematological: Negative.   Psychiatric/Behavioral: The patient is nervous/anxious.   All other systems reviewed and are negative.   BP 130/80  Pulse 65  Ht 5\' 8"  (1.727 m)  Wt 163 lb 4 oz (74.05 kg)  BMI 24.83 kg/m2  Physical Exam  Nursing note and vitals reviewed. Constitutional: She is oriented to person, place, and time. She appears well-developed and well-nourished.  HENT:  Head: Normocephalic.  Nose: Nose normal.  Mouth/Throat: Oropharynx is clear and moist.  Eyes: Conjunctivae are normal. Pupils are equal, round, and reactive to light.  Neck: Normal range of motion. Neck supple. No JVD present.  Cardiovascular: Normal rate, regular rhythm, S1 normal, S2 normal, normal heart sounds and intact distal pulses.  Exam reveals no gallop and no friction rub.   No murmur heard. Pulmonary/Chest: Effort normal and breath sounds normal. No respiratory distress. She has no wheezes. She has no rales. She exhibits no tenderness.  Abdominal: Soft. Bowel sounds are normal. She exhibits no distension. There is no tenderness.  Musculoskeletal: Normal range of motion. She exhibits no edema and no tenderness.  Lymphadenopathy:    She has no cervical adenopathy.  Neurological: She is alert and oriented to person, place, and time. Coordination normal.  Skin: Skin is warm and dry. No rash noted. No erythema.  Psychiatric: She has a normal mood and affect. Her behavior is normal. Judgment and thought content normal.    Assessment and Plan

## 2013-12-31 NOTE — Assessment & Plan Note (Signed)
Cholesterol is at goal on the current lipid regimen. No changes to the medications were made.  

## 2014-01-19 ENCOUNTER — Encounter: Payer: Self-pay | Admitting: General Surgery

## 2014-01-19 ENCOUNTER — Ambulatory Visit (INDEPENDENT_AMBULATORY_CARE_PROVIDER_SITE_OTHER): Payer: BC Managed Care – PPO | Admitting: General Surgery

## 2014-01-19 VITALS — BP 112/80 | HR 74 | Resp 12 | Ht 68.0 in | Wt 163.0 lb

## 2014-01-19 DIAGNOSIS — D0510 Intraductal carcinoma in situ of unspecified breast: Secondary | ICD-10-CM

## 2014-01-19 DIAGNOSIS — D0590 Unspecified type of carcinoma in situ of unspecified breast: Secondary | ICD-10-CM

## 2014-01-19 MED ORDER — POLYETHYLENE GLYCOL 3350 17 GM/SCOOP PO POWD: 1.0000 | Freq: Once | ORAL | Status: DC

## 2014-01-19 NOTE — Patient Instructions (Addendum)
Patient to return in six months. Colonoscopy A colonoscopy is an exam to look at the entire large intestine (colon). This exam can help find problems such as tumors, polyps, inflammation, and areas of bleeding. The exam takes about 1 hour.  LET [facility] CARE PROVIDER KNOW ABOUT:   Any allergies you have.  All medicines you are taking, including vitamins, herbs, eye drops, creams, and over-the-counter medicines.  Previous problems you or members of your family have had with the use of anesthetics.  Any blood disorders you have.  Previous surgeries you have had.  Medical conditions you have. RISKS AND COMPLICATIONS  Generally, this is a safe procedure. However, as with any procedure, complications can occur. Possible complications include:  Bleeding.  Tearing or rupture of the colon wall.  Reaction to medicines given during the exam.  Infection (rare). BEFORE THE PROCEDURE   Ask your health care provider about changing or stopping your regular medicines.  You may be prescribed an oral bowel prep. This involves drinking a large amount of medicated liquid, starting the day before your procedure. The liquid will cause you to have multiple loose stools until your stool is almost clear or light green. This cleans out your colon in preparation for the procedure.  Do not eat or drink anything else once you have started the bowel prep, unless your health care provider tells you it is safe to do so.  Arrange for someone to drive you home after the procedure. PROCEDURE   You will be given medicine to help you relax (sedative).  You will lie on your side with your knees bent.  A long, flexible tube with a light and camera on the end (colonoscope) will be inserted through the rectum and into the colon. The camera sends video back to a computer screen as it moves through the colon. The colonoscope also releases carbon dioxide gas to inflate the colon. This helps your health care provider  see the area better.  During the exam, your health care provider may take a small tissue sample (biopsy) to be examined under a microscope if any abnormalities are found.  The exam is finished when the entire colon has been viewed. AFTER THE PROCEDURE   Do not drive for 24 hours after the exam.  You may have a small amount of blood in your stool.  You may pass moderate amounts of gas and have mild abdominal cramping or bloating. This is caused by the gas used to inflate your colon during the exam.  Ask when your test results will be ready and how you will get your results. Make sure you get your test results. Document Released: 07/21/2000 Document Revised: 05/14/2013 Document Reviewed: 03/31/2013 [facility] Patient Information 2014 [place].  Patient has been scheduled for a colonoscopy at [facility] on 02/11/14. She is aware to pre register with the hospital at least 2 days prior. Miralax prescription sent into her pharmacy. Patient is aware of date and instructions.

## 2014-01-19 NOTE — Progress Notes (Signed)
Patient ID: Adriana Cobb, female   DOB: November 23, 1950, 63 y.o.   MRN: [id number]  Chief Complaint  Patient presents with  . Follow-up    breast cancer    HPI Adriana Cobb is a 63 y.o. female here today for  her four month follow up breast cancer.   The procedure was performed on 08/25/13. Patient had a right mastectomy for DCIS. She had prior invasive CA on left treated with MRM, chemo and radiation .No new problems at this time.   HPI  Past Medical History  Diagnosis Date  . Unspecified essential hypertension   . Unspecified hypothyroidism   . Hyperlipidemia   . Palpitations   . Paroxysmal supraventricular tachycardia   . History of breast cancer     left breast   . Personal history of tobacco use, presenting hazards to health   . H/O cystitis   . Cancer 2006    left breast, treated with Radiation and Chemo  . Hemorrhoids     Past Surgical History  Procedure Laterality Date  . Mastectomy Left 2006    left breast  . Breast biopsy Left 2006    stereo breast bx  . Colonoscopy  2007    Dr. Allen Norris  . Cesarean section  1987, 1988    x 2  . Mastectomy  2015    right breast     Family History  Problem Relation Age of Onset  . Cancer Other     breast cancer  . Hypertension Mother     Social History History  Substance Use Topics  . Smoking status: Former Smoker -- 1.00 packs/day for 10 years    Types: Cigarettes    Quit date: 09/18/1978  . Smokeless tobacco: Never Used  . Alcohol Use: Yes     Comment: bottle a wine weekly    Allergies  Allergen Reactions  . Enalapril     Cough     Current Outpatient Prescriptions  Medication Sig Dispense Refill  . aspirin 81 MG tablet Take 81 mg by mouth daily.      . Cholecalciferol (VITAMIN D PO) Take 1 tablet by mouth daily.      . citalopram (CELEXA) 20 MG tablet Take 20 mg by mouth daily.      Marland Kitchen levothyroxine (SYNTHROID, LEVOTHROID) 50 MCG tablet Take 50 mcg by mouth daily before breakfast.      . Multiple Vitamin  (MULTIVITAMIN) tablet Take 1 tablet by mouth daily.      . simvastatin (ZOCOR) 40 MG tablet Take 40 mg by mouth every evening.      Marland Kitchen SOTALOL AF 80 MG TABS Take 40 mg by mouth 2 (two) times daily.      . polyethylene glycol powder (GLYCOLAX/MIRALAX) powder Take 255 g (1 Container total) by mouth once.  255 g  0   No current facility-administered medications for this visit.    Review of Systems Review of Systems  Constitutional: Negative.   Respiratory: Negative.   Cardiovascular: Negative.     Blood pressure 112/80, pulse 74, resp. rate 12, height 5\' 8"  (1.727 m), weight 163 lb (73.936 kg).  Physical Exam Physical Exam  Constitutional: She is oriented to person, place, and time. She appears well-developed and well-nourished.  Eyes: Conjunctivae are normal. No scleral icterus.  Neck: Neck supple.  Cardiovascular: Normal rate and normal heart sounds.   Pulmonary/Chest: Effort normal and breath sounds normal.  Chest wall incision looks clean and well healed. no sign of local  recurrence  Abdominal: Soft. Bowel sounds are normal. There is no tenderness.  Neurological: She is alert and oriented to person, place, and time.  Skin: Skin is warm and dry.    Data Reviewed None  Assessment    Pt has had bil mastectomies. No antihormonal therapy needed. She is interested in reconstruction and has an appt with Dr. Tula Nakayama in a couple of weeks. She also needs a screening colonoscopy, last one >4yrs ago.    Plan    Patient to return in six months.     Patient has been scheduled for a colonoscopy at [facility] on 02/11/14. She is aware to pre register with the hospital at least 2 days prior. Miralax prescription sent into her pharmacy. Patient is aware of date and instructions.     SANKAR,SEEPLAPUTHUR G 01/21/2014, 7:37 AM

## 2014-01-21 ENCOUNTER — Encounter: Payer: Self-pay | Admitting: General Surgery

## 2014-02-03 ENCOUNTER — Telehealth: Payer: Self-pay | Admitting: *Deleted

## 2014-02-03 NOTE — Telephone Encounter (Signed)
Pt just wanted to let you know that she may have to have a hysterectomy but she doesn't know yet until she sees Dr.Suzanne Sabra Heck at [facility].

## 2014-02-05 ENCOUNTER — Other Ambulatory Visit: Payer: Self-pay | Admitting: General Surgery

## 2014-02-05 DIAGNOSIS — Z1211 Encounter for screening for malignant neoplasm of colon: Secondary | ICD-10-CM

## 2014-02-11 ENCOUNTER — Ambulatory Visit: Payer: Self-pay | Admitting: General Surgery

## 2014-02-11 DIAGNOSIS — Z1211 Encounter for screening for malignant neoplasm of colon: Secondary | ICD-10-CM

## 2014-02-12 ENCOUNTER — Encounter: Payer: Self-pay | Admitting: General Surgery

## 2014-05-27 DIAGNOSIS — C50912 Malignant neoplasm of unspecified site of left female breast: Secondary | ICD-10-CM

## 2014-05-27 DIAGNOSIS — C50911 Malignant neoplasm of unspecified site of right female breast: Secondary | ICD-10-CM | POA: Insufficient documentation

## 2014-06-08 ENCOUNTER — Encounter: Payer: Self-pay | Admitting: General Surgery

## 2014-06-08 HISTORY — PX: BREAST RECONSTRUCTION: SHX9

## 2014-07-20 ENCOUNTER — Encounter: Payer: Self-pay | Admitting: General Surgery

## 2014-07-20 ENCOUNTER — Ambulatory Visit (INDEPENDENT_AMBULATORY_CARE_PROVIDER_SITE_OTHER): Payer: BC Managed Care – PPO | Admitting: General Surgery

## 2014-07-20 VITALS — BP 120/70 | HR 74 | Resp 12 | Ht 68.0 in | Wt 169.0 lb

## 2014-07-20 DIAGNOSIS — D0511 Intraductal carcinoma in situ of right breast: Secondary | ICD-10-CM

## 2014-07-20 NOTE — Patient Instructions (Signed)
Patient to return in six months  

## 2014-07-20 NOTE — Progress Notes (Signed)
Patient ID: Adriana Cobb, female   DOB: 10-Jan-1951, 63 y.o.   MRN: [id number]  Chief Complaint  Patient presents with  . Follow-up    breast cancer    HPI Adriana Cobb is a 63 y.o. female here today for herfollow up breast cancer. The procedure was performed on 08/25/13. Patient had a right mastectomy for DCIS. She had prior invasive CA on left treated with MRM, chemo and radiation .No new problems at this time. Patient had bilateral breast reconstruction done on 06/08/14 at [facility].  HPI  Past Medical History  Diagnosis Date  . Unspecified essential hypertension   . Unspecified hypothyroidism   . Hyperlipidemia   . Palpitations   . Paroxysmal supraventricular tachycardia   . History of breast cancer     left breast   . Personal history of tobacco use, presenting hazards to health   . H/O cystitis   . Cancer 2006    left breast, treated with Radiation and Chemo  . Hemorrhoids     Past Surgical History  Procedure Laterality Date  . Mastectomy Left 2006    left breast  . Breast biopsy Left 2006    stereo breast bx  . Colonoscopy  2007    Dr. Allen Norris  . Cesarean section  1987, 1988    x 2  . Mastectomy  2015    right breast   . Breast reconstruction Bilateral 06/08/14    Family History  Problem Relation Age of Onset  . Cancer Other     breast cancer  . Hypertension Mother     Social History History  Substance Use Topics  . Smoking status: Former Smoker -- 1.00 packs/day for 10 years    Types: Cigarettes    Quit date: 09/18/1978  . Smokeless tobacco: Never Used  . Alcohol Use: Yes     Comment: bottle a wine weekly    Allergies  Allergen Reactions  . Enalapril     Cough     Current Outpatient Prescriptions  Medication Sig Dispense Refill  . aspirin 81 MG tablet Take 81 mg by mouth daily.    . Cholecalciferol (VITAMIN D PO) Take 1 tablet by mouth daily.    . citalopram (CELEXA) 20 MG tablet Take 20 mg by mouth daily.    Marland Kitchen levothyroxine (SYNTHROID,  LEVOTHROID) 50 MCG tablet Take 50 mcg by mouth daily before breakfast.    . Multiple Vitamin (MULTIVITAMIN) tablet Take 1 tablet by mouth daily.    . polyethylene glycol powder (GLYCOLAX/MIRALAX) powder Take 255 g (1 Container total) by mouth once. 255 g 0  . simvastatin (ZOCOR) 40 MG tablet Take 40 mg by mouth every evening.    Marland Kitchen SOTALOL AF 80 MG TABS Take 40 mg by mouth 2 (two) times daily.     No current facility-administered medications for this visit.    Review of Systems Review of Systems  Constitutional: Negative.   Respiratory: Negative.   Cardiovascular: Negative.     Blood pressure 120/70, pulse 74, resp. rate 12, height 5\' 8"  (1.727 m), weight 169 lb (76.658 kg).  Physical Exam Physical Exam  Constitutional: She is oriented to person, place, and time. She appears well-developed and well-nourished.  Eyes: Conjunctivae are normal. No scleral icterus.  Neck: Neck supple.  Cardiovascular: Normal rate, regular rhythm and normal heart sounds.   Pulmonary/Chest: Effort normal and breath sounds normal.  Bilateral breast reconstruction with very good results so far.All the incisions seem well healed.  There is  no signs of local recurrence of cancer  Abdominal: Soft. Bowel sounds are normal. There is no tenderness.    Lymphadenopathy:    She has no cervical adenopathy.    She has no axillary adenopathy.  Neurological: She is alert and oriented to person, place, and time.  Skin: Skin is warm and dry.    Data Reviewed None  Assessment    Stable exam   History of breast CA- left invasive, right DCIS. S/p bil mastectomy, recent reconstruction. Doing well.    Plan    Patient to return in six months       Shauntel Prest G 07/20/2014, 4:53 PM

## 2014-08-07 DIAGNOSIS — Z1371 Encounter for nonprocreative screening for genetic disease carrier status: Secondary | ICD-10-CM

## 2014-08-07 HISTORY — DX: Encounter for nonprocreative screening for genetic disease carrier status: Z13.71

## 2014-11-28 NOTE — Op Note (Signed)
PATIENT NAME:  Adriana Cobb, Adriana Cobb MR#:  [id number] DATE OF BIRTH:  May 31, 1951  DATE OF PROCEDURE:  08/25/2013  PREOPERATIVE DIAGNOSES:  1.  Ductal carcinoma in situ, right breast.  2.  History of left breast mastectomy for carcinoma.   POSTOPERATIVE DIAGNOSES: 1.  Ductal carcinoma in situ, right breast.  2.  History of left breast mastectomy for carcinoma.   OPERATION: Right total mastectomy with sentinel node biopsy.   SURGEON:  Mckinley Jewel, M.D.   ANESTHESIA: General.   COMPLICATIONS: None.   ESTIMATED BLOOD LOSS: Approximately 50 mL.   DRAINS: Blake drain.   DESCRIPTION OF PROCEDURE: The patient was brought to the operating room after she underwent nuclear contrast injection in the right breast. She was placed in the supine position on the operating table and put to sleep with an LMA. The right breast and axilla were then prepped and draped out as a sterile field. Timeout was performed. An elliptical skin incision from the medial to the lateral aspect was planned. The gamma finder showed a single focus of intense activity in the medial portion of the axilla, just past the axillary tail of the breast. Incision was made along the mapped area around the breast with attention directed to the upper outer aspect for the sentinel node identification. Skin and subcutaneous tissue here was elevated all the way down to the pectoral fold area and from here with the gamma finder a single node about a centimeter long and 0.5 cm wide was identified with focal activity on the gamma finder. This was excised out and sent as sentinel node. Additional evaluation showed no signal activity, no palpable or visible nodes in the axilla. Subsequent report on the sentinel node was with no gross metastatic disease The skin and subcutaneous tissue was then elevated along the superior flap down to the infraclavicular region with bleeding controlled with cautery and similarly, the inferior flap was developed down to the  upper rectus fascia. After bleeding was adequately controlled with cautery and ligatures of 3-0 Vicryl, the breast along with the pectoral fascia was dissected off from the underlying pectoral muscle from the medial to the lateral aspect and then removed. The lateral end of the breast was tagged a stitch for orientation for pathology. The wound was irrigated with some saline and after ensuring hemostasis, a Blake drain was positioned and brought out through a stab incision in the inferior aspect medially and fastened to the skin with a nylon stitch. The subcutaneous tissue was approximated with multiple interrupted stitches of 2-0 Vicryl and the skin was closed with 2 running sutures of 3-0 Monocryl covered with Dermabond. 4 x 4 sponges and ABDs were placed and held in place with a surgical bra. The patient also had a couple of skin tags in the lower right chest wall which she had desired to have removed and these were removed and cauterized. These were not sent for pathology as they appeared totally benign were extremely small. The patient subsequently was extubated and returned to the recovery room in stable condition.    ____________________________ S.Robinette Haines, MD sgs:dp D: 08/25/2013 17:35:30 ET T: 08/26/2013 06:26:35 ET JOB#: [id number]  cc: S.G. Jamal Collin, MD, <Dictator> [facility] Robinette Haines MD ELECTRONICALLY SIGNED 08/27/2013 8:04

## 2015-01-18 ENCOUNTER — Ambulatory Visit (INDEPENDENT_AMBULATORY_CARE_PROVIDER_SITE_OTHER): Payer: BLUE CROSS/BLUE SHIELD | Admitting: General Surgery

## 2015-01-18 ENCOUNTER — Encounter: Payer: Self-pay | Admitting: General Surgery

## 2015-01-18 VITALS — BP 130/74 | HR 68 | Resp 14 | Ht 66.0 in | Wt 167.0 lb

## 2015-01-18 DIAGNOSIS — Z853 Personal history of malignant neoplasm of breast: Secondary | ICD-10-CM

## 2015-01-18 NOTE — Patient Instructions (Signed)
Call the office if any new issues or concerns.

## 2015-01-18 NOTE — Progress Notes (Signed)
Patient ID: Adriana Cobb, female   DOB: May 26, 1951, 64 y.o.   MRN: [id number]  No chief complaint on file.   HPI Adriana Cobb is a 64 y.o. female here today for who presents for a breast cancer follow up. Patient doing well, no complaints. Patient had a bilateral flap reconstruction of her breasts, abdominal source. Areolar tattoo scheduled on 03/24/15 with Dr. Harle Stanford at [facility]. Has not had hysterectomy yet. Colonoscopy done last yr-normal. HPI  Past Medical History  Diagnosis Date  . Unspecified essential hypertension   . Unspecified hypothyroidism   . Hyperlipidemia   . Palpitations   . Paroxysmal supraventricular tachycardia   . History of breast cancer     left breast   . Personal history of tobacco use, presenting hazards to health   . H/O cystitis   . Cancer 2006    left breast, treated with Radiation and Chemo  . Hemorrhoids     Past Surgical History  Procedure Laterality Date  . Mastectomy Left 2006    left breast  . Breast biopsy Left 2006    stereo breast bx  . Colonoscopy  2007    Dr. Allen Norris  . Cesarean section  1987, 1988    x 2  . Mastectomy  2015    right breast   . Breast reconstruction Bilateral 06/08/14    Family History  Problem Relation Age of Onset  . Cancer Other     breast cancer  . Hypertension Mother     Social History History  Substance Use Topics  . Smoking status: Former Smoker -- 1.00 packs/day for 10 years    Types: Cigarettes    Quit date: 09/18/1978  . Smokeless tobacco: Never Used  . Alcohol Use: Yes     Comment: bottle a wine weekly    Allergies  Allergen Reactions  . Enalapril     Cough     Current Outpatient Prescriptions  Medication Sig Dispense Refill  . aspirin 81 MG tablet Take 81 mg by mouth daily.    . Cholecalciferol (VITAMIN D PO) Take 1 tablet by mouth daily.    . citalopram (CELEXA) 20 MG tablet Take 20 mg by mouth daily.    Marland Kitchen levothyroxine (SYNTHROID, LEVOTHROID) 50 MCG tablet Take 50 mcg by mouth daily  before breakfast.    . polyethylene glycol powder (GLYCOLAX/MIRALAX) powder Take 255 g (1 Container total) by mouth once. 255 g 0  . simvastatin (ZOCOR) 40 MG tablet Take 40 mg by mouth every evening.    Marland Kitchen SOTALOL AF 80 MG TABS Take 40 mg by mouth 2 (two) times daily.     No current facility-administered medications for this visit.    Review of Systems Review of Systems  Constitutional: Negative.   Respiratory: Negative.   Cardiovascular: Negative.     Blood pressure 130/74, pulse 68, resp. rate 14, height 5\' 6"  (1.676 m), weight 167 lb (75.751 kg).  Physical Exam Physical Exam  Constitutional: She is oriented to person, place, and time. She appears well-developed and well-nourished.  Eyes: No scleral icterus.  Neck: Neck supple.  Cardiovascular: Normal rate, regular rhythm and normal heart sounds.   Pulmonary/Chest: Effort normal and breath sounds normal.  Mastectomy sites bilaterally well healed. Post reconstruction using abdominal tram flaps. No evidence of local recurrence.   Abdominal: Soft. Bowel sounds are normal. She exhibits no distension and no mass. There is no hepatomegaly. There is no tenderness. There is no rebound and no guarding.  Lymphadenopathy:  She has no cervical adenopathy.    She has no axillary adenopathy.  Neurological: She is alert and oriented to person, place, and time.  Skin: Skin is warm and dry.    Data Reviewed Prior notes  Assessment    Personal history of malignant neoplasm of breast, bilateral. Post bilateral mastectomy-left MRM for invasive CA, right simple for DCIS  and bilateral tram flap reconstruction. Decided against antihormonal therapy at present     Plan    Follow up in one year. Call office if any new issues or concerns.       PCP:No PCP  Andrue Dini G 01/18/2015, 4:59 PM

## 2015-04-16 ENCOUNTER — Ambulatory Visit: Payer: Self-pay | Admitting: Cardiovascular Disease

## 2015-05-21 ENCOUNTER — Ambulatory Visit: Payer: Self-pay | Admitting: Cardiovascular Disease

## 2015-07-14 ENCOUNTER — Ambulatory Visit (INDEPENDENT_AMBULATORY_CARE_PROVIDER_SITE_OTHER): Payer: BLUE CROSS/BLUE SHIELD | Admitting: Cardiovascular Disease

## 2015-07-14 ENCOUNTER — Encounter: Payer: Self-pay | Admitting: Cardiovascular Disease

## 2015-07-14 VITALS — BP 110/70 | HR 67 | Ht 68.0 in | Wt 173.2 lb

## 2015-07-14 DIAGNOSIS — E785 Hyperlipidemia, unspecified: Secondary | ICD-10-CM | POA: Diagnosis not present

## 2015-07-14 DIAGNOSIS — I471 Supraventricular tachycardia: Secondary | ICD-10-CM | POA: Diagnosis not present

## 2015-07-14 DIAGNOSIS — D0511 Intraductal carcinoma in situ of right breast: Secondary | ICD-10-CM | POA: Diagnosis not present

## 2015-07-14 NOTE — Patient Instructions (Signed)
You are doing well. No medication changes were made.  Please call us if you have new issues that need to be addressed before your next appt.  Your physician wants you to follow-up in: 12 months.  You will receive a reminder letter in the mail two months in advance. If you don't receive a letter, please call our office to schedule the follow-up appointment. 

## 2015-07-14 NOTE — Assessment & Plan Note (Signed)
No recent episodes of SVT. Sotalol down to 40 mg twice a day.

## 2015-07-14 NOTE — Assessment & Plan Note (Signed)
Stable, recent reconstruction surgery went well

## 2015-07-14 NOTE — Assessment & Plan Note (Signed)
Cholesterol is at goal on the current lipid regimen. No changes to the medications were made.  

## 2015-07-14 NOTE — Progress Notes (Signed)
Patient ID: Adriana Cobb, female    DOB: [birth year], 65 y.o.   MRN: [id number]  HPI Comments: Adriana Cobb is a very pleasant 64 year old woman with history of breast cancer on the left, mastectomy, chemotherapy, radiation treatments in 2006, history of SVT with 2 episodes in 2008 treated with adenosine, initially started on amiodarone and changed to sotalol.  history of hyperlipidemia. She presents for routine followup of her SVT and hyperlipidemia  In follow-up today, she has had breast reconstruction surgery at [place] procedure, reported it went very well, 3 stages of surgery. Healed well, very happy with the outcome  Priormastectomy on the right for recurrent cancer. Sentinel node was negative.   Denies any episodes of SVT. Has been taking sotalol 40 mg twice a day Very scared to come off the medication out of fear of recurrent arrhythmia She denies any shortness of breath or chest pain with exertion  Lab work reviewed with her showing total cholesterol 156, HDL 58, LDL 78, normal CBC  EKG on today's visit shows normal sinus rhythm with rate 67 bpm, no significant ST or T-wave changes  Other past medical history  SVT episodes in 2008.   Prior cholesterol 2008 shows total cholesterol 241, LDL 158, prior to starting simvastatin Most recent lab work showing total cholesterol 155, LDL 69, HDL 66  Studies done at [place] Echocardiogram in 2008 was essentially normal with mild MR Echocardiogram May 2009 documented moderate pulmonic regurgitation and moderate MR Echocardiogram November 2009 showed mild to moderate MR Stress test June 2000 and shows no ischemia, she exercised on treadmill for 8 minutes 30 seconds achieved 10 METS    Allergies  Allergen Reactions  . Enalapril     Cough     Current Outpatient Prescriptions on File Prior to Visit  Medication Sig Dispense Refill  . Cholecalciferol (VITAMIN D PO) Take 1 tablet by mouth daily.    . citalopram (CELEXA) 20  MG tablet Take 20 mg by mouth daily.    Marland Kitchen levothyroxine (SYNTHROID, LEVOTHROID) 50 MCG tablet Take 50 mcg by mouth daily before breakfast.    . simvastatin (ZOCOR) 40 MG tablet Take 40 mg by mouth every evening.    Marland Kitchen SOTALOL AF 80 MG TABS Take 40 mg by mouth 2 (two) times daily.     No current facility-administered medications on file prior to visit.    Past Medical History  Diagnosis Date  . Unspecified essential hypertension   . Unspecified hypothyroidism   . Hyperlipidemia   . Palpitations   . Paroxysmal supraventricular tachycardia ([place])   . History of breast cancer     left breast   . Personal history of tobacco use, presenting hazards to health   . H/O cystitis   . Cancer [facility]) 2006    left breast, treated with Radiation and Chemo  . Hemorrhoids     Past Surgical History  Procedure Laterality Date  . Mastectomy Left 2006    left breast  . Breast biopsy Left 2006    stereo breast bx  . Colonoscopy  2007    Dr. Allen Norris  . Cesarean section  1987, 1988    x 2  . Mastectomy  2015    right breast   . Breast reconstruction Bilateral 06/08/14    Social History  reports that she quit smoking about 36 years ago. Her smoking use included Cigarettes. She has a 10 pack-year smoking history. She has never used smokeless tobacco. She reports that she  drinks alcohol. She reports that she does not use illicit drugs.  Family History family history includes Cancer in her other; Hypertension in her mother.  Review of Systems  Constitutional: Negative.   Respiratory: Negative.   Cardiovascular: Negative.   Gastrointestinal: Negative.   Musculoskeletal: Negative.   Neurological: Negative.   Hematological: Negative.   Psychiatric/Behavioral: Negative.   All other systems reviewed and are negative.   BP 110/70 mmHg  Pulse 67  Ht 5\' 8"  (1.727 m)  Wt 173 lb 4 oz (78.586 kg)  BMI 26.35 kg/m2  Physical Exam  Constitutional: She is oriented to person, place, and time. She appears  well-developed and well-nourished.  HENT:  Head: Normocephalic.  Nose: Nose normal.  Mouth/Throat: Oropharynx is clear and moist.  Eyes: Conjunctivae are normal. Pupils are equal, round, and reactive to light.  Neck: Normal range of motion. Neck supple. No JVD present.  Cardiovascular: Normal rate, regular rhythm, S1 normal, S2 normal, normal heart sounds and intact distal pulses.  Exam reveals no gallop and no friction rub.   No murmur heard. Pulmonary/Chest: Effort normal and breath sounds normal. No respiratory distress. She has no wheezes. She has no rales. She exhibits no tenderness.  Abdominal: Soft. Bowel sounds are normal. She exhibits no distension. There is no tenderness.  Musculoskeletal: Normal range of motion. She exhibits no edema or tenderness.  Lymphadenopathy:    She has no cervical adenopathy.  Neurological: She is alert and oriented to person, place, and time. Coordination normal.  Skin: Skin is warm and dry. No rash noted. No erythema.  Psychiatric: She has a normal mood and affect. Her behavior is normal. Judgment and thought content normal.    Assessment and Plan  Nursing note and vitals reviewed.

## 2015-11-03 ENCOUNTER — Encounter: Payer: Self-pay | Admitting: *Deleted

## 2015-12-30 ENCOUNTER — Other Ambulatory Visit: Payer: Self-pay

## 2015-12-30 MED ORDER — SOTALOL HCL (AF) 80 MG PO TABS: 40.0000 mg | ORAL_TABLET | Freq: Two times a day (BID) | ORAL | Status: DC

## 2016-01-11 ENCOUNTER — Encounter: Payer: Self-pay | Admitting: *Deleted

## 2016-01-18 ENCOUNTER — Ambulatory Visit: Payer: BLUE CROSS/BLUE SHIELD | Admitting: General Surgery

## 2016-01-19 DIAGNOSIS — H02831 Dermatochalasis of right upper eyelid: Secondary | ICD-10-CM | POA: Diagnosis not present

## 2016-01-24 DIAGNOSIS — J300 Vasomotor rhinitis: Secondary | ICD-10-CM | POA: Diagnosis not present

## 2016-01-24 DIAGNOSIS — H02834 Dermatochalasis of left upper eyelid: Secondary | ICD-10-CM | POA: Diagnosis not present

## 2016-01-24 DIAGNOSIS — H02831 Dermatochalasis of right upper eyelid: Secondary | ICD-10-CM | POA: Diagnosis not present

## 2016-01-24 DIAGNOSIS — H5340 Unspecified visual field defects: Secondary | ICD-10-CM | POA: Diagnosis not present

## 2016-03-07 ENCOUNTER — Encounter: Payer: Self-pay | Admitting: *Deleted

## 2016-03-20 DIAGNOSIS — H02831 Dermatochalasis of right upper eyelid: Secondary | ICD-10-CM | POA: Diagnosis not present

## 2016-03-20 DIAGNOSIS — H02834 Dermatochalasis of left upper eyelid: Secondary | ICD-10-CM | POA: Diagnosis not present

## 2016-03-20 DIAGNOSIS — H5340 Unspecified visual field defects: Secondary | ICD-10-CM | POA: Diagnosis not present

## 2016-04-27 DIAGNOSIS — D485 Neoplasm of uncertain behavior of skin: Secondary | ICD-10-CM | POA: Diagnosis not present

## 2016-06-28 ENCOUNTER — Encounter: Payer: Self-pay | Admitting: *Deleted

## 2016-07-04 ENCOUNTER — Ambulatory Visit (INDEPENDENT_AMBULATORY_CARE_PROVIDER_SITE_OTHER): Payer: BLUE CROSS/BLUE SHIELD | Admitting: General Surgery

## 2016-07-04 VITALS — BP 128/74 | Resp 14 | Ht 67.0 in | Wt 173.0 lb

## 2016-07-04 DIAGNOSIS — Z853 Personal history of malignant neoplasm of breast: Secondary | ICD-10-CM | POA: Diagnosis not present

## 2016-07-04 MED ORDER — ANASTROZOLE 1 MG PO TABS: 1.0000 mg | ORAL_TABLET | Freq: Every day | ORAL | 4 refills | Status: DC

## 2016-07-04 NOTE — Progress Notes (Addendum)
Patient ID: Adriana Cobb, female   DOB: 02-Jan-1951, 65 y.o.   MRN: [id number]  Chief Complaint  Patient presents with  . Breast Cancer    HPI Adriana Cobb is a 65 y.o. female here today for her one year breast cancer check, s/p bilateral mastectomy and flap reconstruction of her breasts, abdominal source.No complaints I have reviewed the history of present illness with the patient.   HPI  Past Medical History:  Diagnosis Date  . Cancer [facility]) 2006   left breast, treated with Radiation and Chemo  . H/O cystitis   . Hemorrhoids   . History of breast cancer    left breast   . Hyperlipidemia   . Palpitations   . Paroxysmal supraventricular tachycardia ([place])   . Personal history of tobacco use, presenting hazards to health   . Unspecified essential hypertension   . Unspecified hypothyroidism     Past Surgical History:  Procedure Laterality Date  . BREAST BIOPSY Left 2006   stereo breast bx  . BREAST RECONSTRUCTION Bilateral 06/08/14  . [place]   x 2  . COLONOSCOPY  2007   Dr. Allen Norris  . MASTECTOMY Left 2006   left breast  . MASTECTOMY  2015   right breast     Family History  Problem Relation Age of Onset  . Cancer Other     breast cancer  . Hypertension Mother     Social History Social History  Substance Use Topics  . Smoking status: Former Smoker    Packs/day: 1.00    Years: 10.00    Types: Cigarettes    Quit date: 09/18/1978  . Smokeless tobacco: Never Used  . Alcohol use Yes     Comment: bottle a wine weekly    Allergies  Allergen Reactions  . Enalapril     Cough     Current Outpatient Prescriptions  Medication Sig Dispense Refill  . Cholecalciferol (VITAMIN D PO) Take 1 tablet by mouth daily.    . citalopram (CELEXA) 20 MG tablet Take 20 mg by mouth daily.    Marland Kitchen levothyroxine (SYNTHROID, LEVOTHROID) 50 MCG tablet Take 50 mcg by mouth daily before breakfast.    . simvastatin (ZOCOR) 40 MG tablet Take 40 mg by mouth every  evening.    Marland Kitchen SOTALOL AF 80 MG TABS Take 0.5 tablets (40 mg total) by mouth 2 (two) times daily. 60 each 6  . anastrozole (ARIMIDEX) 1 MG tablet Take 1 tablet (1 mg total) by mouth daily. 90 tablet 4   No current facility-administered medications for this visit.     Review of Systems Review of Systems  Constitutional: Negative.   Respiratory: Negative.   Cardiovascular: Negative.     Blood pressure 128/74, resp. rate 14, height 5\' 7"  (1.702 m), weight 173 lb (78.5 kg).  Physical Exam Physical Exam  Constitutional: She is oriented to person, place, and time. She appears well-developed and well-nourished.  Eyes: Conjunctivae are normal. No scleral icterus.  Neck: Neck supple.  Cardiovascular: Normal rate, regular rhythm and normal heart sounds.   Pulmonary/Chest: Effort normal and breath sounds normal.  Incision sites well healed. Both breast reconstructions appear to be doing well with excellent cosmetic results  Abdominal: Soft. Bowel sounds are normal. She exhibits no mass. There is no hepatomegaly. There is no tenderness.  Lymphadenopathy:    She has no cervical adenopathy.    She has no axillary adenopathy.  Neurological: She is alert and oriented to person,  place, and time.  Skin: Skin is warm and dry.    Data Reviewed Prior notes.  Assessment   Invasive CA left breast, s/p MRM, chemo/rad in 2006. DCIS, ER pos right breast s/p SM. Subsequent bil breast reconstruction. Doing well  Plan Return in 1 year for exam. She is not on antihormonal therapy.Will discuss with oncology regarding value of therapy at present     This information has been scribed by Gaspar Cola CMA.   Mariama Saintvil G 07/10/2016, 11:12 AM

## 2016-07-04 NOTE — Patient Instructions (Signed)
Return in 1 year for breast check.

## 2016-07-10 ENCOUNTER — Encounter: Payer: Self-pay | Admitting: General Surgery

## 2016-08-01 DIAGNOSIS — Z01419 Encounter for gynecological examination (general) (routine) without abnormal findings: Secondary | ICD-10-CM | POA: Diagnosis not present

## 2016-08-01 DIAGNOSIS — Z1239 Encounter for other screening for malignant neoplasm of breast: Secondary | ICD-10-CM | POA: Diagnosis not present

## 2016-08-01 DIAGNOSIS — N951 Menopausal and female climacteric states: Secondary | ICD-10-CM | POA: Diagnosis not present

## 2016-08-01 DIAGNOSIS — N952 Postmenopausal atrophic vaginitis: Secondary | ICD-10-CM | POA: Diagnosis not present

## 2016-10-25 DIAGNOSIS — K136 Irritative hyperplasia of oral mucosa: Secondary | ICD-10-CM | POA: Diagnosis not present

## 2016-11-05 DIAGNOSIS — K136 Irritative hyperplasia of oral mucosa: Secondary | ICD-10-CM | POA: Diagnosis not present

## 2016-11-11 NOTE — Progress Notes (Signed)
Cardiology Office Note  Date:  11/13/2016   ID:  Adriana Cobb, DOB Dec 31, 1950, MRN [id number]  PCP:  No PCP Per Patient   Chief Complaint  Patient presents with  . other    12 month follow up. Meds reviewed by the pt. verbally. "doing well."     HPI:   Ms. Adriana Cobb is a very pleasant 66 year old woman with history of  breast cancer on left, mastectomy/chemotherapy/radiation treatments 2006,  SVT with 2 episodes in 2008 treated with adenosine, initially started on amiodarone and changed to sotalol.  hyperlipidemia She presents for routine followup of her SVT and hyperlipidemia    History of breast reconstruction surgery at [place] procedure, 3 stages of surgery Prior mastectomy on the right for recurrent cancer.   Denies any episodes of SVT.  Continues taking sotalol 40 mg twice a day Feels it is helping her prior history of tachycardia from anxiety as well as Very scared to come off the medication out of fear of recurrent arrhythmia She denies any shortness of breath or chest pain with exertion  Lab work reviewed with her Previous lab work, total cholesterol 156, HDL 58, LDL 78, normal CBC She has new lab work through OB/GYN but this is not available  EKG on today's visit shows normal sinus rhythm with rate 68 bpm, no significant ST or T-wave changes  Other past medical history  SVT episodes in 2008.   Prior cholesterol 2008 shows total cholesterol 241, LDL 158, prior to starting simvastatin Most recent lab work showing total cholesterol 155, LDL 69, HDL 66  Studies done at [place] Echocardiogram in 2008 was essentially normal with mild MR Echocardiogram May 2009 documented moderate pulmonic regurgitation and moderate MR Echocardiogram November 2009 showed mild to moderate MR Stress test June 2000 and shows no ischemia, she exercised on treadmill for 8 minutes 30 seconds achieved 10 METS   PMH:   has a past medical history of Cancer ([place]) (2006);  H/O cystitis; Hemorrhoids; History of breast cancer; Hyperlipidemia; Palpitations; Paroxysmal supraventricular tachycardia ([place]); Personal history of tobacco use, presenting hazards to health; Unspecified essential hypertension; and Unspecified hypothyroidism.  PSH:    Past Surgical History:  Procedure Laterality Date  . BREAST BIOPSY Left 2006   stereo breast bx  . BREAST RECONSTRUCTION Bilateral 06/08/14  . [place]   x 2  . COLONOSCOPY  2007   Dr. Allen Norris  . MASTECTOMY Left 2006   left breast  . MASTECTOMY  2015   right breast     Current Outpatient Prescriptions  Medication Sig Dispense Refill  . anastrozole (ARIMIDEX) 1 MG tablet Take 1 tablet (1 mg total) by mouth daily. 90 tablet 4  . Cholecalciferol (VITAMIN D PO) Take 1 tablet by mouth daily.    . citalopram (CELEXA) 20 MG tablet Take 20 mg by mouth daily.    Marland Kitchen levothyroxine (SYNTHROID, LEVOTHROID) 50 MCG tablet Take 50 mcg by mouth daily before breakfast.    . simvastatin (ZOCOR) 40 MG tablet Take 40 mg by mouth every evening.    Marland Kitchen SOTALOL AF 80 MG TABS Take 0.5 tablets (40 mg total) by mouth 2 (two) times daily. 60 each 6   No current facility-administered medications for this visit.      Allergies:   Enalapril   Social History:  The patient  reports that she quit smoking about 38 years ago. Her smoking use included Cigarettes. She has a 10.00 pack-year smoking history. She has never  used smokeless tobacco. She reports that she drinks alcohol. She reports that she does not use drugs.   Family History:   family history includes Cancer in her other; Hypertension in her mother.    Review of Systems: Review of Systems  Constitutional: Negative.   Respiratory: Negative.   Cardiovascular: Negative.   Gastrointestinal: Negative.   Musculoskeletal: Negative.   Neurological: Negative.   Psychiatric/Behavioral: The patient is nervous/anxious.   All other systems reviewed and are  negative.    PHYSICAL EXAM: VS:  BP 120/80 (BP Location: Left Arm, Patient Position: Sitting, Cuff Size: Normal)   Pulse 68   Ht 5\' 8"  (1.727 m)   Wt 176 lb 12 oz (80.2 kg)   BMI 26.87 kg/m  , BMI Body mass index is 26.87 kg/m. GEN: Well nourished, well developed, in no acute distress  HEENT: normal  Neck: no JVD, carotid bruits, or masses Cardiac: RRR; no murmurs, rubs, or gallops,no edema  Respiratory:  clear to auscultation bilaterally, normal work of breathing GI: soft, nontender, nondistended, + BS MS: no deformity or atrophy  Skin: warm and dry, no rash Neuro:  Strength and sensation are intact Psych: euthymic mood, full affect    Recent Labs: No results found for requested labs within last 8760 hours.    Lipid Panel No results found for: CHOL, HDL, LDLCALC, TRIG    Wt Readings from Last 3 Encounters:  11/13/16 176 lb 12 oz (80.2 kg)  07/04/16 173 lb (78.5 kg)  07/14/15 173 lb 4 oz (78.6 kg)       ASSESSMENT AND PLAN:  Mixed hyperlipidemia Currently on simvastatin We have recommended that she obtain previous lab work for our records She had this done through OB/GYN  Paroxysmal SVT (supraventricular tachycardia) (HCC) No significant episodes, she feels more comfortable staying on low-dose sotalol Discussed carotid sinus massage, Valsalva maneuver Discussed ablation, risk and benefit  Cancer ([place]) Prior history of breast cancer, mastectomy  Hypothyroidism, unspecified type Managed by OB/GYN and nurse at work   Total encounter time more than 25 minutes  Greater than 50% was spent in counseling and coordination of care with the patient   Disposition:   F/U  12 months  No orders of the defined types were placed in this encounter.    Signed, Esmond Plants, M.D., Ph.D. 11/13/2016  [place], [place]

## 2016-11-13 ENCOUNTER — Ambulatory Visit (INDEPENDENT_AMBULATORY_CARE_PROVIDER_SITE_OTHER): Payer: BLUE CROSS/BLUE SHIELD | Admitting: Cardiovascular Disease

## 2016-11-13 ENCOUNTER — Encounter: Payer: Self-pay | Admitting: Cardiovascular Disease

## 2016-11-13 VITALS — BP 120/80 | HR 68 | Ht 68.0 in | Wt 176.8 lb

## 2016-11-13 DIAGNOSIS — E782 Mixed hyperlipidemia: Secondary | ICD-10-CM | POA: Diagnosis not present

## 2016-11-13 DIAGNOSIS — I471 Supraventricular tachycardia: Secondary | ICD-10-CM

## 2016-11-13 DIAGNOSIS — C801 Malignant (primary) neoplasm, unspecified: Secondary | ICD-10-CM | POA: Diagnosis not present

## 2016-11-13 DIAGNOSIS — E039 Hypothyroidism, unspecified: Secondary | ICD-10-CM | POA: Diagnosis not present

## 2016-11-13 MED ORDER — SOTALOL HCL (AF) 80 MG PO TABS: 40.0000 mg | ORAL_TABLET | Freq: Two times a day (BID) | ORAL | 4 refills | Status: DC

## 2016-11-13 NOTE — Patient Instructions (Signed)

## 2017-02-22 DIAGNOSIS — C44722 Squamous cell carcinoma of skin of right lower limb, including hip: Secondary | ICD-10-CM | POA: Diagnosis not present

## 2017-02-22 DIAGNOSIS — L821 Other seborrheic keratosis: Secondary | ICD-10-CM | POA: Diagnosis not present

## 2017-02-22 DIAGNOSIS — D485 Neoplasm of uncertain behavior of skin: Secondary | ICD-10-CM | POA: Diagnosis not present

## 2017-03-02 ENCOUNTER — Telehealth: Payer: Self-pay | Admitting: Obstetrics and Gynecology

## 2017-03-02 NOTE — Telephone Encounter (Signed)
Pt is requesting refill on her Vitamin D 200 Iu, Levoxyl 50 mg . Pt uses [facility]. Please advise. Pt would like a call patient

## 2017-03-05 ENCOUNTER — Other Ambulatory Visit: Payer: Self-pay | Admitting: Obstetrics and Gynecology

## 2017-03-05 NOTE — Telephone Encounter (Signed)
Have pharmacy send Rx for Vit D3 2000 IU because I don't know of that Rx or how to write it since it's an OTC supplement. Will do bone density ref at annual.

## 2017-03-05 NOTE — Telephone Encounter (Signed)
RN to notify pt she is due for thyroid labs, if not done in last month. Pt had at work in the past, or can do here.  Will Rx levo RF once I have lab results. Pt can do OTC Vit D3 2000 IU daily and doesn't need Rx.

## 2017-03-05 NOTE — Telephone Encounter (Signed)
Left detailed msg.

## 2017-03-05 NOTE — Telephone Encounter (Signed)
Pt aware to have labs done.  Will have done at work.  Pt likes having vitamin D thru pharm d/t lower cost ($4 for 90d).  Also, needs ref for bone density.

## 2017-03-07 ENCOUNTER — Telehealth: Payer: Self-pay | Admitting: Obstetrics and Gynecology

## 2017-03-07 MED ORDER — LEVOTHYROXINE SODIUM 50 MCG PO TABS: 50.0000 ug | ORAL_TABLET | Freq: Every day | ORAL | 1 refills | Status: DC

## 2017-03-07 NOTE — Telephone Encounter (Signed)
Pt's TSH=2.410 and free T4=1.32. Pt on levo 50 mcg daily except 2 tabs on Sundays. Rx RF. REchk in 6 months (pt does at work).

## 2017-03-08 NOTE — Telephone Encounter (Signed)
LM for pt with normal thyroid labs. Rx RF.

## 2017-03-12 NOTE — Telephone Encounter (Signed)
[facility] faxed request for vitamin D 2,000 IU. Called pharmacy and authorized refills through 08/06/17 when patient's annual is due.

## 2017-03-13 NOTE — Telephone Encounter (Signed)
Great!

## 2017-03-26 ENCOUNTER — Ambulatory Visit
Admission: RE | Admit: 2017-03-26 | Discharge: 2017-03-26 | Disposition: A | Discharge status: Home / Self Care | Payer: BLUE CROSS/BLUE SHIELD | Source: Ambulatory Visit | Attending: Family Medicine | Admitting: Family Medicine

## 2017-03-26 ENCOUNTER — Other Ambulatory Visit: Payer: Self-pay | Admitting: Family Medicine

## 2017-03-26 DIAGNOSIS — R52 Pain, unspecified: Secondary | ICD-10-CM

## 2017-03-26 DIAGNOSIS — R609 Edema, unspecified: Secondary | ICD-10-CM | POA: Insufficient documentation

## 2017-03-26 DIAGNOSIS — M79672 Pain in left foot: Secondary | ICD-10-CM | POA: Insufficient documentation

## 2017-03-26 DIAGNOSIS — S99922A Unspecified injury of left foot, initial encounter: Secondary | ICD-10-CM | POA: Diagnosis not present

## 2017-03-27 ENCOUNTER — Ambulatory Visit (INDEPENDENT_AMBULATORY_CARE_PROVIDER_SITE_OTHER): Payer: BLUE CROSS/BLUE SHIELD | Admitting: Podiatry

## 2017-03-27 DIAGNOSIS — S92302A Fracture of unspecified metatarsal bone(s), left foot, initial encounter for closed fracture: Secondary | ICD-10-CM

## 2017-03-28 DIAGNOSIS — L905 Scar conditions and fibrosis of skin: Secondary | ICD-10-CM | POA: Diagnosis not present

## 2017-03-28 DIAGNOSIS — C44722 Squamous cell carcinoma of skin of right lower limb, including hip: Secondary | ICD-10-CM | POA: Diagnosis not present

## 2017-04-04 NOTE — Progress Notes (Signed)
   HPI: 66 year old female presents to the office today for constant throbbing pain to her left foot. Patient states that she twisted her foot approximately 2 weeks ago on a wet rock. Patient went to the emergency department where x-rays were taken and she was diagnosed with fractures of the left foot. Walking and standing aggravate the pain. She is currently taking ibuprofen to give her some relief. She presents today for follow-up treatment and evaluation     Physical Exam: General: The patient is alert and oriented x3 in no acute distress.  Dermatology: Skin is warm, dry and supple bilateral lower extremities. Negative for open lesions or macerations.  Vascular: Palpable pedal pulses bilaterally. No erythema noted. Capillary refill within normal limits. There is moderate edema with ecchymosis noted to the left foot dorsally  Neurological: Epicritic and protective threshold grossly intact bilaterally.   Musculoskeletal Exam: Significant pain on palpation noted to the bases of the second third metatarsals left foot. Range of motion within normal limits to all pedal and ankle joints bilateral. Muscle strength 5/5 in all groups bilateral.    Assessment: 1. Fracture of metatarsal bases 2 and 3 left foot-nondisplaced, closed   Plan of Care:  1. Patient was evaluated. Previous x-rays taken and in EPIC were reviewed today 2. Today a short immobilization cam boot was dispensed. Patient can be partial weightbearing in a cam boot 3. Continue ibuprofen when necessary  4. Return to clinic in 4 weeks   Edrick Kins, DPM [facility]  Dr. Edrick Kins, DPM    [address], [place] [address]                Office [phone]  Fax [phone]

## 2017-04-27 ENCOUNTER — Encounter: Payer: Self-pay | Admitting: Podiatry

## 2017-04-27 ENCOUNTER — Ambulatory Visit (INDEPENDENT_AMBULATORY_CARE_PROVIDER_SITE_OTHER): Payer: BLUE CROSS/BLUE SHIELD | Admitting: Podiatry

## 2017-04-27 ENCOUNTER — Ambulatory Visit (INDEPENDENT_AMBULATORY_CARE_PROVIDER_SITE_OTHER): Payer: BLUE CROSS/BLUE SHIELD

## 2017-04-27 DIAGNOSIS — S92302A Fracture of unspecified metatarsal bone(s), left foot, initial encounter for closed fracture: Secondary | ICD-10-CM | POA: Diagnosis not present

## 2017-04-27 DIAGNOSIS — S92302D Fracture of unspecified metatarsal bone(s), left foot, subsequent encounter for fracture with routine healing: Secondary | ICD-10-CM | POA: Diagnosis not present

## 2017-04-30 NOTE — Progress Notes (Signed)
   HPI: 66 year old female presents to the office today for follow up evaluation of a fracture to the left foot. She reports pain to the dorsum of the left foot. She is unsure if the pain is from wearing the CAM boot. She presents today for follow-up treatment and evaluation.    Physical Exam: General: The patient is alert and oriented x3 in no acute distress.  Dermatology: Skin is warm, dry and supple bilateral lower extremities. Negative for open lesions or macerations.  Vascular: Palpable pedal pulses bilaterally. No erythema noted. Capillary refill within normal limits. There is moderate edema with ecchymosis noted to the left foot dorsally  Neurological: Epicritic and protective threshold grossly intact bilaterally.   Musculoskeletal Exam: Pain on palpation noted to the left midfoot. Range of motion within normal limits to all pedal and ankle joints bilateral. Muscle strength 5/5 in all groups bilateral.   Radiographic Exam: nondisplaced transverse fractures through the proximal second and third metatarsals-healed   Assessment: 1. Fracture of metatarsal bases 2 and 3 left foot-nondisplaced, closed-healed 2. Midfoot capsulitis left   Plan of Care:  1. Patient was evaluated. X-Rays reviewed. 2. Appt with Liliane Channel for custom molded orthotics.  3. Discontinue wearing CAM boot. Recommended good shoe gear. 4. Return to clinic when necessary.   Edrick Kins, DPM [facility]  Dr. Edrick Kins, DPM    [address], [place] [address]                Office [phone]  Fax [phone]

## 2017-05-09 ENCOUNTER — Ambulatory Visit (INDEPENDENT_AMBULATORY_CARE_PROVIDER_SITE_OTHER): Payer: BLUE CROSS/BLUE SHIELD | Admitting: Orthotics

## 2017-05-09 DIAGNOSIS — S92302D Fracture of unspecified metatarsal bone(s), left foot, subsequent encounter for fracture with routine healing: Secondary | ICD-10-CM

## 2017-05-09 DIAGNOSIS — S92302A Fracture of unspecified metatarsal bone(s), left foot, initial encounter for closed fracture: Secondary | ICD-10-CM

## 2017-05-09 NOTE — Progress Notes (Signed)
Patient presents today for CFO based upon recommendation by Dr. Amalia Hailey.   Patient hs hx of fx 2nd 3rd met left.  Patient has pes cavus foot type. Goals    None    take pressure off dorsum/mid foot joint by providing arch support and raising heel 1/8"..  Plan on Rich to fab

## 2017-05-30 ENCOUNTER — Other Ambulatory Visit: Payer: BLUE CROSS/BLUE SHIELD | Admitting: Orthotics

## 2017-06-07 ENCOUNTER — Ambulatory Visit: Payer: BLUE CROSS/BLUE SHIELD | Admitting: General Surgery

## 2017-06-13 ENCOUNTER — Ambulatory Visit (INDEPENDENT_AMBULATORY_CARE_PROVIDER_SITE_OTHER): Payer: BLUE CROSS/BLUE SHIELD | Admitting: Orthotics

## 2017-06-13 DIAGNOSIS — S92302D Fracture of unspecified metatarsal bone(s), left foot, subsequent encounter for fracture with routine healing: Secondary | ICD-10-CM

## 2017-06-13 DIAGNOSIS — S92302A Fracture of unspecified metatarsal bone(s), left foot, initial encounter for closed fracture: Secondary | ICD-10-CM

## 2017-06-13 NOTE — Progress Notes (Signed)
Patient came in today to pick up custom made foot orthotics.  The goals were accomplished and the patient reported no dissatisfaction with said orthotics.  Patient was advised of breakin period and how to report any issues. 

## 2017-06-27 ENCOUNTER — Ambulatory Visit: Payer: BLUE CROSS/BLUE SHIELD | Admitting: General Surgery

## 2017-07-03 ENCOUNTER — Ambulatory Visit (INDEPENDENT_AMBULATORY_CARE_PROVIDER_SITE_OTHER): Payer: BLUE CROSS/BLUE SHIELD | Admitting: General Surgery

## 2017-07-03 ENCOUNTER — Encounter: Payer: Self-pay | Admitting: General Surgery

## 2017-07-03 VITALS — BP 130/76 | HR 77 | Resp 12 | Ht 68.0 in | Wt 172.0 lb

## 2017-07-03 DIAGNOSIS — Z853 Personal history of malignant neoplasm of breast: Secondary | ICD-10-CM | POA: Diagnosis not present

## 2017-07-03 DIAGNOSIS — Z86000 Personal history of in-situ neoplasm of breast: Secondary | ICD-10-CM

## 2017-07-03 NOTE — Progress Notes (Signed)
Patient ID: Adriana Cobb, female   DOB: [birth year], 66 y.o.   MRN: [id number]  Chief Complaint  Patient presents with  . Follow-up    HPI Adriana Cobb is a 66 y.o. female with a history of breast cancer s/p bilateral mastectomy and flap reconstruction, who presents for her follow up. No new issues.  Pt brought labs with her today.   HPI  Past Medical History:  Diagnosis Date  . Cancer [facility]) 2006   left breast, treated with Radiation and Chemo  . H/O cystitis   . Hemorrhoids   . History of breast cancer    left breast   . Hyperlipidemia   . Palpitations   . Paroxysmal supraventricular tachycardia ([place])   . Personal history of tobacco use, presenting hazards to health   . Unspecified essential hypertension   . Unspecified hypothyroidism     Past Surgical History:  Procedure Laterality Date  . BREAST BIOPSY Left 2006   stereo breast bx  . BREAST RECONSTRUCTION Bilateral 06/08/14  . [place]   x 2  . COLONOSCOPY  2007   Dr. Allen Norris  . MASTECTOMY Left 2006   left breast  . MASTECTOMY  2015   right breast     Family History  Problem Relation Age of Onset  . Cancer Other        breast cancer  . Hypertension Mother     Social History Social History   Tobacco Use  . Smoking status: Former Smoker    Packs/day: 1.00    Years: 10.00    Pack years: 10.00    Types: Cigarettes    Last attempt to quit: 09/18/1978    Years since quitting: 38.8  . Smokeless tobacco: Never Used  Substance Use Topics  . Alcohol use: Yes    Comment: bottle a wine weekly  . Drug use: No    Allergies  Allergen Reactions  . Enalapril     Cough     Current Outpatient Medications  Medication Sig Dispense Refill  . anastrozole (ARIMIDEX) 1 MG tablet Take 1 tablet (1 mg total) by mouth daily. 90 tablet 4  . Cholecalciferol (VITAMIN D PO) Take 1 tablet by mouth daily.    . citalopram (CELEXA) 20 MG tablet Take 20 mg by mouth daily.    Marland Kitchen levothyroxine (SYNTHROID,  LEVOTHROID) 50 MCG tablet Take 1 tablet (50 mcg total) by mouth daily before breakfast. Except take 2 tabs on Sundays 110 tablet 1  . simvastatin (ZOCOR) 40 MG tablet Take 40 mg by mouth every evening.    Marland Kitchen SOTALOL AF 80 MG TABS Take 0.5 tablets (40 mg total) by mouth 2 (two) times daily. 90 each 4   No current facility-administered medications for this visit.     Review of Systems Review of Systems  Constitutional: Negative.   Respiratory: Negative.   Cardiovascular: Negative.     Blood pressure 130/76, pulse 77, resp. rate 12, height 5\' 8"  (1.727 m), weight 172 lb (78 kg), SpO2 97 %.  Physical Exam Physical Exam  Constitutional: She is oriented to person, place, and time. She appears well-developed and well-nourished.  Eyes: Conjunctivae are normal. No scleral icterus.  Neck: Neck supple.  Cardiovascular: Normal rate, regular rhythm and normal heart sounds.  Pulmonary/Chest: Effort normal and breath sounds normal.  Bilateral breast reconstruction incisions well healed. No evidence of recurrence.   Abdominal: Soft. Bowel sounds are normal. There is no hepatosplenomegaly.  Lymphadenopathy:  She has no cervical adenopathy.    She has no axillary adenopathy.  Neurological: She is alert and oriented to person, place, and time.  Skin: Skin is warm and dry.  Psychiatric: She has a normal mood and affect. Her behavior is normal.    Data Reviewed Labs, previous notes  Assessment    Invasive left breast cancer. Right breast DCIS, ER positive. Completed one year anastrozole.  Subsequent bilateral breast reconstruction with tram flap. Mammogram and physical exam stable. May discontinue antihormonal therapy as it is only indicated as a preventative agent and the patient has had a bilateral mastectomy.     Plan    May discontinue anastrozole. Follow up with Dr. Bary Castilla in one year.      HPI, Physical Exam, Assessment and Plan have been scribed under the direction and in the  presence of Mckinley Jewel, MD Karie Fetch, RN  I have completed the exam and reviewed the above documentation for accuracy and completeness.  I agree with the above.  Haematologist has been used and any errors in dictation or transcription are unintentional.  Jernie Schutt G. Jamal Collin, M.D., F.A.C.S.  Junie Panning G 07/04/2017, 4:52 PM

## 2017-07-03 NOTE — Patient Instructions (Signed)
Follow up with Dr. Bary Castilla in one year.     The patient is aware to call back for any questions or concerns.

## 2017-07-13 ENCOUNTER — Other Ambulatory Visit: Payer: Self-pay | Admitting: General Surgery

## 2017-07-13 DIAGNOSIS — L821 Other seborrheic keratosis: Secondary | ICD-10-CM | POA: Diagnosis not present

## 2017-08-09 ENCOUNTER — Ambulatory Visit: Payer: BLUE CROSS/BLUE SHIELD | Admitting: Obstetrics and Gynecology

## 2017-08-11 DIAGNOSIS — D0511 Intraductal carcinoma in situ of right breast: Secondary | ICD-10-CM

## 2017-08-11 HISTORY — DX: Intraductal carcinoma in situ of right breast: D05.11

## 2017-08-27 ENCOUNTER — Ambulatory Visit: Payer: BLUE CROSS/BLUE SHIELD | Admitting: Obstetrics and Gynecology

## 2017-09-11 ENCOUNTER — Ambulatory Visit (INDEPENDENT_AMBULATORY_CARE_PROVIDER_SITE_OTHER): Payer: BLUE CROSS/BLUE SHIELD | Admitting: Obstetrics and Gynecology

## 2017-09-11 ENCOUNTER — Encounter: Payer: Self-pay | Admitting: Obstetrics and Gynecology

## 2017-09-11 VITALS — BP 138/80 | HR 80 | Ht 68.0 in | Wt 172.0 lb

## 2017-09-11 DIAGNOSIS — Z01419 Encounter for gynecological examination (general) (routine) without abnormal findings: Secondary | ICD-10-CM

## 2017-09-11 DIAGNOSIS — E782 Mixed hyperlipidemia: Secondary | ICD-10-CM

## 2017-09-11 DIAGNOSIS — Z124 Encounter for screening for malignant neoplasm of cervix: Secondary | ICD-10-CM | POA: Diagnosis not present

## 2017-09-11 DIAGNOSIS — F419 Anxiety disorder, unspecified: Secondary | ICD-10-CM | POA: Diagnosis not present

## 2017-09-11 DIAGNOSIS — E039 Hypothyroidism, unspecified: Secondary | ICD-10-CM | POA: Diagnosis not present

## 2017-09-11 DIAGNOSIS — Z1151 Encounter for screening for human papillomavirus (HPV): Secondary | ICD-10-CM | POA: Diagnosis not present

## 2017-09-11 DIAGNOSIS — Z1231 Encounter for screening mammogram for malignant neoplasm of breast: Secondary | ICD-10-CM

## 2017-09-11 DIAGNOSIS — Z1239 Encounter for other screening for malignant neoplasm of breast: Secondary | ICD-10-CM

## 2017-09-11 MED ORDER — SIMVASTATIN 40 MG PO TABS: 40.0000 mg | ORAL_TABLET | Freq: Every evening | ORAL | 3 refills | Status: DC

## 2017-09-11 MED ORDER — CITALOPRAM HYDROBROMIDE 20 MG PO TABS: 20.0000 mg | ORAL_TABLET | Freq: Every day | ORAL | 3 refills | Status: DC

## 2017-09-11 MED ORDER — LEVOTHYROXINE SODIUM 50 MCG PO TABS: 50.0000 ug | ORAL_TABLET | Freq: Every day | ORAL | 1 refills | Status: DC

## 2017-09-11 NOTE — Progress Notes (Signed)
PCP: Patient, No Pcp Per   Chief Complaint  Patient presents with  . Gynecologic Exam    HPI:      Ms. Adriana Cobb is a 67 y.o. [id number] who LMP was No LMP recorded. Patient is postmenopausal., presents today for her annual examination.  Her menses are absent due to menopause. She does not have intermenstrual bleeding. She does have vasomotor sx--can't have HRT due to breast cancer hx.   Sex activity: not sexually active. She does have vaginal dryness.  Last Pap: July 23, 2014  Results were: no abnormalities /neg HPV DNA.  Hx of STDs: none  Last mammogram: no longer gets them due to bilat mastectomy with reconstruction. Followed by Dr. Jamal Collin. Stopped arimidex 12/18. Pt is s/p bilat breast cancer, 2 different types.  Pt is MyRisk neg 2016. There is no FH of breast cancer. There is no FH of ovarian cancer. The patient does do self-breast exams.  Colonoscopy: colonoscopy 3 years ago with abnormalities.  Repeat due after 10 years.   Tobacco use: The patient denies current or previous tobacco use. Alcohol use: social drinker Exercise: not active  She does get adequate calcium and Vitamin D in her diet.  She takes citalopram 20 mg daily to "take the edge off" with sx releif. No side effects. She wants to cont meds.  She takes levo 50 mcg daily for hypothyroidism. Was euthyroid 11/18 on work labs. Doing well.  She takes simvastatin 40 mg daily for hyperlipidemia with good lab results 11/18. No side effects. Total: 150, TG:69, HDL: 51; LDL: 85   Past Medical History:  Diagnosis Date  . BRCA negative 2016   MyRisk neg  . Cancer [facility]) 2006   left breast, treated with Radiation and Chemo  . H/O cystitis   . Hemorrhoids   . History of breast cancer    left breast   . Hyperlipidemia   . Hypothyroid   . Palpitations   . Paroxysmal supraventricular tachycardia ([place])   . Personal history of tobacco use, presenting hazards to health   . Unspecified essential hypertension     . Unspecified hypothyroidism     Past Surgical History:  Procedure Laterality Date  . BREAST BIOPSY Left 2006   stereo breast bx  . BREAST RECONSTRUCTION Bilateral 06/08/14  . [place]   x 2  . COLONOSCOPY  2007   Dr. Allen Norris  . MASTECTOMY Left 2006   left breast  . MASTECTOMY  2015   right breast     Family History  Problem Relation Age of Onset  . Cancer Other        breast cancer  . Hypertension Mother     Social History   Socioeconomic History  . Marital status: Married    Spouse name: Not on file  . Number of children: Not on file  . Years of education: Not on file  . Highest education level: Not on file  Social Needs  . Financial resource strain: Not on file  . Food insecurity - worry: Not on file  . Food insecurity - inability: Not on file  . Transportation needs - medical: Not on file  . Transportation needs - non-medical: Not on file  Occupational History  . Not on file  Tobacco Use  . Smoking status: Former Smoker    Packs/day: 1.00    Years: 10.00    Pack years: 10.00    Types: Cigarettes    Last attempt to  quit: 09/18/1978    Years since quitting: 39.0  . Smokeless tobacco: Never Used  Substance and Sexual Activity  . Alcohol use: Yes    Comment: bottle a wine weekly  . Drug use: No  . Sexual activity: Not on file  Other Topics Concern  . Not on file  Social History Narrative  . Not on file    Current Meds  Medication Sig  . Cholecalciferol (VITAMIN D PO) Take 1 tablet by mouth daily.  . citalopram (CELEXA) 20 MG tablet Take 1 tablet (20 mg total) by mouth daily.  Marland Kitchen levothyroxine (SYNTHROID, LEVOTHROID) 50 MCG tablet Take 1 tablet (50 mcg total) by mouth daily before breakfast.  . simvastatin (ZOCOR) 40 MG tablet Take 1 tablet (40 mg total) by mouth every evening.  Marland Kitchen SOTALOL AF 80 MG TABS Take 0.5 tablets (40 mg total) by mouth 2 (two) times daily.  . [DISCONTINUED] anastrozole (ARIMIDEX) 1 MG tablet TAKE 1 TABLET (1 MG  TOTAL) BY MOUTH DAILY.  . [DISCONTINUED] citalopram (CELEXA) 20 MG tablet Take 20 mg by mouth daily.  . [DISCONTINUED] levothyroxine (SYNTHROID, LEVOTHROID) 50 MCG tablet Take 1 tablet (50 mcg total) by mouth daily before breakfast. Except take 2 tabs on Sundays  . [DISCONTINUED] simvastatin (ZOCOR) 40 MG tablet Take 40 mg by mouth every evening.      ROS:  Review of Systems  Constitutional: Negative for fatigue, fever and unexpected weight change.  Respiratory: Negative for cough, shortness of breath and wheezing.   Cardiovascular: Negative for chest pain, palpitations and leg swelling.  Gastrointestinal: Negative for blood in stool, constipation, diarrhea, nausea and vomiting.  Endocrine: Negative for cold intolerance, heat intolerance and polyuria.  Genitourinary: Negative for dyspareunia, dysuria, flank pain, frequency, genital sores, hematuria, menstrual problem, pelvic pain, urgency, vaginal bleeding, vaginal discharge and vaginal pain.  Musculoskeletal: Negative for back pain, joint swelling and myalgias.  Skin: Negative for rash.  Neurological: Negative for dizziness, syncope, light-headedness, numbness and headaches.  Hematological: Negative for adenopathy.  Psychiatric/Behavioral: Negative for agitation, confusion, sleep disturbance and suicidal ideas. The patient is not nervous/anxious.      Objective: BP 138/80   Pulse 80   Ht '5\' 8"'$  (1.727 m)   Wt 172 lb (78 kg)   BMI 26.15 kg/m    Physical Exam  Constitutional: She is oriented to person, place, and time. She appears well-developed and well-nourished.  Genitourinary: Vagina normal and uterus normal. There is no rash or tenderness on the right labia. There is no rash or tenderness on the left labia. No erythema or tenderness in the vagina. No vaginal discharge found. Right adnexum does not display mass and does not display tenderness. Left adnexum does not display mass and does not display tenderness. Cervix does not  exhibit motion tenderness or polyp. Uterus is not enlarged or tender.  Neck: Normal range of motion. No thyromegaly present.  Cardiovascular: Normal rate, regular rhythm and normal heart sounds.  No murmur heard. Pulmonary/Chest: Effort normal and breath sounds normal. Right breast exhibits no mass, no nipple discharge, no skin change and no tenderness. Left breast exhibits no mass, no nipple discharge, no skin change and no tenderness.  Abdominal: Soft. There is no tenderness. There is no guarding.  Musculoskeletal: Normal range of motion.  Neurological: She is alert and oriented to person, place, and time. No cranial nerve deficit.  Psychiatric: She has a normal mood and affect. Her behavior is normal.  Vitals reviewed.   Assessment/Plan:  Encounter for annual  routine gynecological examination  Cervical cancer screening - Plan: IGP, Aptima HPV  Screening for HPV (human papillomavirus) - Plan: IGP, Aptima HPV  Screening for breast cancer - Pt followed by DR. Byrnett now. No longer has mammos. My Risk neg.  Mixed hyperlipidemia - Rx RF zocor. Rechk in 1 yr. - Plan: simvastatin (ZOCOR) 40 MG tablet  Anxiety - Rx RF citalopram. Doing well. F/u prn. - Plan: citalopram (CELEXA) 20 MG tablet  Acquired hypothyroidism - Rx RF levo 50 mcg daily. REchk in 6 months.  - Plan: levothyroxine (SYNTHROID, LEVOTHROID) 50 MCG tablet, TSH + free T4   Meds ordered this encounter  Medications  . citalopram (CELEXA) 20 MG tablet    Sig: Take 1 tablet (20 mg total) by mouth daily.    Dispense:  90 tablet    Refill:  3  . levothyroxine (SYNTHROID, LEVOTHROID) 50 MCG tablet    Sig: Take 1 tablet (50 mcg total) by mouth daily before breakfast.    Dispense:  90 tablet    Refill:  1  . simvastatin (ZOCOR) 40 MG tablet    Sig: Take 1 tablet (40 mg total) by mouth every evening.    Dispense:  90 tablet    Refill:  3          GYN counsel breast self exam, menopause, adequate intake of calcium and  vitamin D, diet and exercise    F/U  Return in about 1 year (around 09/11/2018).  Alicia B. Copland, PA-C 09/11/2017 9:01 AM

## 2017-09-11 NOTE — Patient Instructions (Signed)
I value your feedback and entrusting us with your care. If you get a [facility] patient survey, I would appreciate you taking the time to let us know about your experience today. Thank you! 

## 2017-09-13 LAB — IGP, APTIMA HPV
HPV APTIMA: NEGATIVE
PAP SMEAR COMMENT: 0

## 2017-10-17 ENCOUNTER — Encounter: Payer: Self-pay | Admitting: Obstetrics and Gynecology

## 2017-10-17 NOTE — Telephone Encounter (Signed)
Please advise 

## 2017-10-18 ENCOUNTER — Other Ambulatory Visit: Payer: Self-pay | Admitting: Obstetrics and Gynecology

## 2017-10-18 DIAGNOSIS — Z13820 Encounter for screening for osteoporosis: Secondary | ICD-10-CM

## 2017-10-18 NOTE — Progress Notes (Signed)
Patient is aware of appointment on Monday, 11/12/17 @ 1:00pm at [facility], and is aware to not wear metal on her clothing.

## 2017-10-18 NOTE — Progress Notes (Unsigned)
Order for DEXA due to age.

## 2017-11-05 DIAGNOSIS — M8580 Other specified disorders of bone density and structure, unspecified site: Secondary | ICD-10-CM

## 2017-11-05 HISTORY — DX: Other specified disorders of bone density and structure, unspecified site: M85.80

## 2017-11-08 ENCOUNTER — Encounter: Payer: Self-pay | Admitting: Obstetrics and Gynecology

## 2017-11-12 ENCOUNTER — Ambulatory Visit
Admission: RE | Admit: 2017-11-12 | Discharge: 2017-11-12 | Disposition: A | Discharge status: Home / Self Care | Payer: BLUE CROSS/BLUE SHIELD | Source: Ambulatory Visit | Attending: Obstetrics and Gynecology | Admitting: Obstetrics and Gynecology

## 2017-11-12 DIAGNOSIS — M85851 Other specified disorders of bone density and structure, right thigh: Secondary | ICD-10-CM | POA: Diagnosis not present

## 2017-11-12 DIAGNOSIS — Z780 Asymptomatic menopausal state: Secondary | ICD-10-CM | POA: Diagnosis not present

## 2017-11-12 DIAGNOSIS — M8588 Other specified disorders of bone density and structure, other site: Secondary | ICD-10-CM | POA: Diagnosis not present

## 2017-11-12 DIAGNOSIS — M8589 Other specified disorders of bone density and structure, multiple sites: Secondary | ICD-10-CM | POA: Diagnosis not present

## 2017-11-12 DIAGNOSIS — Z13820 Encounter for screening for osteoporosis: Secondary | ICD-10-CM | POA: Diagnosis not present

## 2017-11-13 ENCOUNTER — Encounter: Payer: Self-pay | Admitting: Obstetrics and Gynecology

## 2018-05-18 ENCOUNTER — Other Ambulatory Visit: Payer: Self-pay | Admitting: Obstetrics and Gynecology

## 2018-05-18 DIAGNOSIS — E039 Hypothyroidism, unspecified: Secondary | ICD-10-CM

## 2018-07-01 ENCOUNTER — Ambulatory Visit: Payer: BLUE CROSS/BLUE SHIELD | Admitting: Obstetrics and Gynecology

## 2018-07-02 ENCOUNTER — Encounter: Payer: Self-pay | Admitting: General Surgery

## 2018-07-02 ENCOUNTER — Other Ambulatory Visit: Payer: Self-pay

## 2018-07-02 ENCOUNTER — Ambulatory Visit (INDEPENDENT_AMBULATORY_CARE_PROVIDER_SITE_OTHER): Payer: BLUE CROSS/BLUE SHIELD | Admitting: General Surgery

## 2018-07-02 VITALS — BP 145/86 | HR 79 | Temp 97.9°F | Resp 19 | Ht 68.0 in | Wt 180.4 lb

## 2018-07-02 DIAGNOSIS — Z853 Personal history of malignant neoplasm of breast: Secondary | ICD-10-CM | POA: Diagnosis not present

## 2018-07-02 NOTE — Progress Notes (Signed)
Patient ID: Adriana Cobb, female   DOB: 04/01/1951, 67 y.o.   MRN: [id number]  Chief Complaint  Patient presents with  . Follow-up    1 yr f/u rec Breast Cancer, no mammos    HPI Adriana Cobb is a 67 y.o. female here today for 1 yr follow up for Breast Cancer, no mammogram. Patient would like to have a MRI for prevention.  When questioned as to what in particular she would like to have an MRI of, she waved her hand from the top of her head to her toes, wanting to make sure no occult cancer had been missed.    States she is feeling well.  HPI  Past Medical History:  Diagnosis Date  . BRCA negative 2016   MyRisk neg  . 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   . Cancer ([place]) 2006   pT2,N2a,Mx; ER/PR negative, Her 2 neu: 3+ ([facility]). Dermal lymphatic involvement, 9/ 22 nodes positive with extranodal extension.  Treated with Cytoxan, Adriamycin, Taxol and Herceptin.  . H/O cystitis   . Hemorrhoids   . Hyperlipidemia   . Hypothyroid   . Osteopenia 11/2017   [facility] at [facility]  . Palpitations   . Paroxysmal supraventricular tachycardia ([place])   . Personal history of tobacco use, presenting hazards to health   . Unspecified essential hypertension   . Unspecified hypothyroidism     Past Surgical History:  Procedure Laterality Date  . BREAST BIOPSY Left 2006   stereo breast bx  . BREAST RECONSTRUCTION Bilateral 06/08/14  . [place]   x 2  . COLONOSCOPY  2007   Dr. Allen Norris  . MASTECTOMY Left 2006   left breast  . MASTECTOMY  2015   right breast     Family History  Problem Relation Age of Onset  . Cancer Other        breast cancer  . Hypertension Mother     Social History Social History   Tobacco Use  . Smoking status: Former Smoker    Packs/day: 1.00    Years: 10.00    Pack years: 10.00    Types: Cigarettes    Last attempt to quit: 09/18/1978    Years since quitting: 39.8  . Smokeless tobacco:  Never Used  Substance Use Topics  . Alcohol use: Yes    Comment: bottle a wine weekly  . Drug use: No    Allergies  Allergen Reactions  . Enalapril     Cough   . Other Other (See Comments)    Medications ending in 'pril'; such as ace inhibitors    Current Outpatient Medications  Medication Sig Dispense Refill  . Cholecalciferol (VITAMIN D PO) Take 1 tablet by mouth daily.    . citalopram (CELEXA) 20 MG tablet Take 1 tablet (20 mg total) by mouth daily. 90 tablet 3  . levothyroxine (SYNTHROID, LEVOTHROID) 75 MCG tablet TAKE ONE TABLET BY MOUTH ONCE DAILY, TAKE ON AN EMPTY STOMACH AND WAIT ONE HOUR BEFORE EATING  3  . simvastatin (ZOCOR) 40 MG tablet Take 1 tablet (40 mg total) by mouth every evening. 90 tablet 3  . SOTALOL AF 80 MG TABS Take 0.5 tablets (40 mg total) by mouth 2 (two) times daily. 90 each 4   No current facility-administered medications for this visit.     Review of Systems Review of Systems  Constitutional: Negative.   Respiratory: Negative.   Cardiovascular:  Negative.     Blood pressure (!) 145/86, pulse 79, temperature 97.9 F (36.6 C), temperature source Temporal, resp. rate 19, height '5\' 8"'  (1.727 m), weight 180 lb 6.4 oz (81.8 kg), SpO2 97 %.  Physical Exam Physical Exam  Constitutional: She is oriented to person, place, and time. She appears well-developed and well-nourished.  Eyes: Conjunctivae are normal. No scleral icterus.  Neck: Normal range of motion.  Cardiovascular: Normal rate, regular rhythm and normal heart sounds.  Pulmonary/Chest: Effort normal and breath sounds normal. Right breast exhibits no inverted nipple, no mass, no nipple discharge, no skin change and no tenderness. Left breast exhibits no inverted nipple, no mass, no nipple discharge, no skin change and no tenderness. Breasts are symmetrical.    Lymphadenopathy:    She has no cervical adenopathy.  Neurological: She is alert and oriented to person, place, and time.  Skin:  Skin is warm and dry.    Data Reviewed The pathology from her original 2006 tumor of the left breast as well as the 2015 intermediate grade DCIS of the right breast was reviewed.  Patient reports she was put on an antiestrogen for about a year and then this was discontinued.  The patient's laboratory from her place of employment dated June 12, 2018 was notable for elevated phosphorus of 4.6 (2.5-4.5 of questionable clinical utility.  Normal creatinine, normal liver function studies, mild elevation of the TSH at 4.97.  Normal CBC with a hemoglobin of 13.6 and MCV of 93.  CA 27-29: 11.3 (down from fifteen 1 year earlier.  Assessment    No evidence of recurrent cancer.    Plan  The patient has a falling CA 27-29, is 13 years out from her high-grade ER/PR negative, HER-2/neu positive tumor and is a little likelihood for recurrent disease at this time.  I explained to her at this time with no clinical suspicion for recurrent disease there was no indication for routine "scanning".    Patient needs to return to the office in 1 year. HPI, Physical Exam, Assessment and Plan have been scribed under the direction and in the presence of Hervey Ard, Md.  Eudelia Bunch R. Bobette Mo, CMA  I have completed the exam and reviewed the above documentation for accuracy and completeness.  I agree with the above.  Haematologist has been used and any errors in dictation or transcription are unintentional.  Hervey Ard, M.D., F.A.C.S.  Forest Gleason Tyrianna Lightle 07/03/2018, 10:54 AM

## 2018-07-02 NOTE — Patient Instructions (Addendum)
Patient needs to return to the office in 1 year.

## 2018-07-03 ENCOUNTER — Encounter: Payer: Self-pay | Admitting: General Surgery

## 2018-07-06 DIAGNOSIS — N390 Urinary tract infection, site not specified: Secondary | ICD-10-CM | POA: Diagnosis not present

## 2018-07-09 ENCOUNTER — Telehealth: Payer: Self-pay

## 2018-07-09 NOTE — Telephone Encounter (Signed)
Spoke w/pt. She states she went to [facility] on Saturday & was treated w/abx for UTI. She appreciates [place] checking up on her. She will contact if she needs further assistance.

## 2018-07-09 NOTE — Telephone Encounter (Signed)
Pt called after hours Nurse line to report urinary burning & frequency. She was advised by Triage Nurse to contact health care provider. 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

## 2018-07-09 NOTE — Telephone Encounter (Signed)
Patient is returning call. Please advise? 

## 2018-07-09 NOTE — Telephone Encounter (Signed)
LMVM TRC. F/U w/pt from after hours triage call to see if she was able to get meds/if she needs any further assistance at this time.

## 2018-07-12 DIAGNOSIS — Z85828 Personal history of other malignant neoplasm of skin: Secondary | ICD-10-CM | POA: Diagnosis not present

## 2018-07-12 DIAGNOSIS — D181 Lymphangioma, any site: Secondary | ICD-10-CM | POA: Diagnosis not present

## 2018-07-12 DIAGNOSIS — D2272 Melanocytic nevi of left lower limb, including hip: Secondary | ICD-10-CM | POA: Diagnosis not present

## 2018-07-12 DIAGNOSIS — L298 Other pruritus: Secondary | ICD-10-CM | POA: Diagnosis not present

## 2018-07-12 DIAGNOSIS — D2261 Melanocytic nevi of right upper limb, including shoulder: Secondary | ICD-10-CM | POA: Diagnosis not present

## 2018-07-12 DIAGNOSIS — D2262 Melanocytic nevi of left upper limb, including shoulder: Secondary | ICD-10-CM | POA: Diagnosis not present

## 2018-07-12 DIAGNOSIS — D485 Neoplasm of uncertain behavior of skin: Secondary | ICD-10-CM | POA: Diagnosis not present

## 2018-07-12 DIAGNOSIS — C44519 Basal cell carcinoma of skin of other part of trunk: Secondary | ICD-10-CM | POA: Diagnosis not present

## 2018-07-24 DIAGNOSIS — C44519 Basal cell carcinoma of skin of other part of trunk: Secondary | ICD-10-CM | POA: Diagnosis not present

## 2018-08-27 ENCOUNTER — Encounter: Payer: Self-pay | Admitting: Obstetrics and Gynecology

## 2018-08-29 ENCOUNTER — Ambulatory Visit: Payer: BLUE CROSS/BLUE SHIELD | Admitting: Internal Medicine

## 2018-08-29 ENCOUNTER — Ambulatory Visit (INDEPENDENT_AMBULATORY_CARE_PROVIDER_SITE_OTHER): Payer: BLUE CROSS/BLUE SHIELD

## 2018-08-29 ENCOUNTER — Other Ambulatory Visit: Payer: Self-pay | Admitting: Internal Medicine

## 2018-08-29 ENCOUNTER — Telehealth: Payer: Self-pay | Admitting: Internal Medicine

## 2018-08-29 ENCOUNTER — Encounter: Payer: Self-pay | Admitting: Internal Medicine

## 2018-08-29 VITALS — BP 136/90 | HR 72 | Temp 99.0°F | Ht 65.0 in | Wt 177.4 lb

## 2018-08-29 DIAGNOSIS — G8929 Other chronic pain: Secondary | ICD-10-CM

## 2018-08-29 DIAGNOSIS — M542 Cervicalgia: Secondary | ICD-10-CM

## 2018-08-29 DIAGNOSIS — M79622 Pain in left upper arm: Secondary | ICD-10-CM

## 2018-08-29 DIAGNOSIS — E039 Hypothyroidism, unspecified: Secondary | ICD-10-CM

## 2018-08-29 DIAGNOSIS — R232 Flushing: Secondary | ICD-10-CM

## 2018-08-29 DIAGNOSIS — M50321 Other cervical disc degeneration at C4-C5 level: Secondary | ICD-10-CM | POA: Diagnosis not present

## 2018-08-29 DIAGNOSIS — M25512 Pain in left shoulder: Secondary | ICD-10-CM

## 2018-08-29 DIAGNOSIS — E782 Mixed hyperlipidemia: Secondary | ICD-10-CM

## 2018-08-29 DIAGNOSIS — I10 Essential (primary) hypertension: Secondary | ICD-10-CM

## 2018-08-29 DIAGNOSIS — I6523 Occlusion and stenosis of bilateral carotid arteries: Secondary | ICD-10-CM

## 2018-08-29 DIAGNOSIS — Z853 Personal history of malignant neoplasm of breast: Secondary | ICD-10-CM | POA: Diagnosis not present

## 2018-08-29 DIAGNOSIS — F419 Anxiety disorder, unspecified: Secondary | ICD-10-CM

## 2018-08-29 MED ORDER — SIMVASTATIN 40 MG PO TABS: 40.0000 mg | ORAL_TABLET | Freq: Every day | ORAL | 3 refills | Status: DC

## 2018-08-29 MED ORDER — CITALOPRAM HYDROBROMIDE 20 MG PO TABS: 20.0000 mg | ORAL_TABLET | Freq: Every day | ORAL | 3 refills | Status: DC

## 2018-08-29 MED ORDER — LEVOTHYROXINE SODIUM 75 MCG PO TABS: ORAL_TABLET | ORAL | 3 refills | Status: DC

## 2018-08-29 NOTE — Telephone Encounter (Signed)
FYI Dr McLean-Scocuzza Pt was given results of Xray Pt has agreed to have a carotid US bilaterally. Please be advised that she has Jury Duty starting 09/16/2018 for 6 weeks. This is [employer].

## 2018-08-29 NOTE — Telephone Encounter (Signed)
Please try to make PET and US carotid same day and needs before 09/16/2018 jury duty   Thanks  [employer]

## 2018-08-29 NOTE — Telephone Encounter (Signed)
-----   Message from Babs Bertin, [facility] sent at 08/29/2018  1:53 PM EST ----- Left message for patient to return call back for xray results.  [facility] may give results and obtain information.

## 2018-08-29 NOTE — Telephone Encounter (Signed)
FYI

## 2018-08-29 NOTE — Patient Instructions (Addendum)
Calcium carbonate 600 mg 2x per day  Vitamin D3 2000 IU daily   Check Tdap vaccine at work   Consider shingles vaccine, prevnar and pneumonia 23 vaccine   Recombinant Zoster (Shingles) Vaccine, RZV: What You Need to Know 1. Why get vaccinated? Shingles (also called herpes zoster, or just zoster) is a painful skin rash, often with blisters. Shingles is caused by the varicella zoster virus, the same virus that causes chickenpox. After you have chickenpox, the virus stays in your body and can cause shingles later in life. You can't catch shingles from another person. However, a person who has never had chickenpox (or chickenpox vaccine) could get chickenpox from someone with shingles. A shingles rash usually appears on one side of the face or body and heals within 2 to 4 weeks. Its main symptom is pain, which can be severe. Other symptoms can include fever, headache, chills, and upset stomach. Very rarely, a shingles infection can lead to pneumonia, hearing problems, blindness, brain inflammation (encephalitis), or death. For about 1 person in 5, severe pain can continue even long after the rash has cleared up. This long-lasting pain is called post-herpetic neuralgia (PHN). Shingles is far more common in people 5 years of age and older than in younger people, and the risk increases with age. It is also more common in people whose immune system is weakened because of a disease such as cancer, or by drugs such as steroids or chemotherapy. At least 1 million people a year in the [place] get shingles. 2. Shingles vaccine (recombinant) Recombinant shingles vaccine was approved by FDA in 2017 for the prevention of shingles. In clinical trials, it was more than 90% effective in preventing shingles. It can also reduce the likelihood of PHN. Two doses, 2 to 6 months apart, are recommended for adults 45 and older. This vaccine is also recommended for people who have already gotten the live shingles  vaccine (Zostavax). There is no live virus in this vaccine. 3. Some people should not get this vaccine Tell your vaccine provider if you:  Have any severe, life-threatening allergies. A person who has ever had a life-threatening allergic reaction after a dose of recombinant shingles vaccine, or has a severe allergy to any component of this vaccine, may be advised not to be vaccinated. Ask your health care provider if you want information about vaccine components.  Are pregnant or breastfeeding. There is not much information about use of recombinant shingles vaccine in pregnant or nursing women. Your healthcare provider might recommend delaying vaccination.  Are not feeling well. If you have a mild illness, such as a cold, you can probably get the vaccine today. If you are moderately or severely ill, you should probably wait until you recover. Your doctor can advise you. 4. Risks of a vaccine reaction With any medicine, including vaccines, there is a chance of reactions. After recombinant shingles vaccination, a person might experience:  Pain, redness, soreness, or swelling at the site of the injection  Headache, muscle aches, fever, shivering, fatigue In clinical trials, most people got a sore arm with mild or moderate pain after vaccination, and some also had redness and swelling where they got the shot. Some people felt tired, had muscle pain, a headache, shivering, fever, stomach pain, or nausea. About 1 out of 6 people who got recombinant zoster vaccine experienced side effects that prevented them from doing regular activities. Symptoms went away on their own in about 2 to 3 days. Side effects were more  common in younger people. You should still get the second dose of recombinant zoster vaccine even if you had one of these reactions after the first dose. Other things that could happen after this vaccine:  People sometimes faint after medical procedures, including vaccination. Sitting or lying  down for about 15 minutes can help prevent fainting and injuries caused by a fall. Tell your provider if you feel dizzy or have vision changes or ringing in the ears.  Some people get shoulder pain that can be more severe and longer-lasting than routine soreness that can follow injections. This happens very rarely.  Any medication can cause a severe allergic reaction. Such reactions to a vaccine are estimated at about 1 in a million doses, and would happen within a few minutes to a few hours after the vaccination. As with any medicine, there is a very remote chance of a vaccine causing a serious injury or death. The safety of vaccines is always being monitored. For more information, visit: http://www.aguilar.org/ 5. What if there is a serious problem? What should I look for?  Look for anything that concerns you, such as signs of a severe allergic reaction, very high fever, or unusual behavior. Signs of a severe allergic reaction can include hives, swelling of the face and throat, difficulty breathing, a fast heartbeat, dizziness, and weakness. These would usually start a few minutes to a few hours after the vaccination. What should I do?  If you think it is a severe allergic reaction or other emergency that can't wait, call 9-1-1 or get to the nearest hospital. Otherwise, call your health care provider. Afterward, the reaction should be reported to the Vaccine Adverse Event Reporting System (VAERS). Your doctor should file this report, or you can do it yourself through the VAERS website at www.vaers.SamedayNews.es, or by calling (223)804-2830. VAERS does not give medical advice. 6. How can I learn more?  Ask your health care provider. He or she can give you the vaccine package insert or suggest other sources of information.  Call your local or state health department.  Contact the Centers for Disease Control and Prevention (CDC): ? Call 508-464-3141 (1-800-CDC-INFO) or ? Visit CDC's vaccines  website at http://hunter.com/ CDC Vaccine Information Statement Recombinant Zoster Vaccine (09/18/2016) This information is not intended to replace advice given to you by your health care provider. Make sure you discuss any questions you have with your health care provider. Document Released: 10/03/2016 Document Revised: 02/27/2018 Document Reviewed: 02/27/2018 Elsevier Interactive Patient Education  2019 [place].  Pneumococcal Vaccine, Polyvalent solution for injection What is this medicine? PNEUMOCOCCAL VACCINE, POLYVALENT (NEU mo KOK al vak SEEN, pol ee VEY luhnt) is a vaccine to prevent pneumococcus bacteria infection. These bacteria are a major cause of ear infections, Strep throat infections, and serious pneumonia, meningitis, or blood infections worldwide. These vaccines help the body to produce antibodies (protective substances) that help your body defend against these bacteria. This vaccine is recommended for people 16 years of age and older with health problems. It is also recommended for all adults over 72 years old. This vaccine will not treat an infection. This medicine may be used for other purposes; ask your health care provider or pharmacist if you have questions. COMMON BRAND NAME(S): Pneumovax 23 What should I tell my health care provider before I take this medicine? They need to know if you have any of these conditions: -bleeding problems -bone marrow or organ transplant -cancer, Hodgkin's disease -fever -infection -immune system problems -low platelet  count in the blood -seizures -an unusual or allergic reaction to pneumococcal vaccine, diphtheria toxoid, other vaccines, latex, other medicines, foods, dyes, or preservatives -pregnant or trying to get pregnant -breast-feeding How should I use this medicine? This vaccine is for injection into a muscle or under the skin. It is given by a health care professional. A copy of Vaccine Information Statements will be given  before each vaccination. Read this sheet carefully each time. The sheet may change frequently. Talk to your pediatrician regarding the use of this medicine in children. While this drug may be prescribed for children as young as 80 years of age for selected conditions, precautions do apply. Overdosage: If you think you have taken too much of this medicine contact a poison control center or emergency room at once. NOTE: This medicine is only for you. Do not share this medicine with others. What if I miss a dose? It is important not to miss your dose. Call your doctor or health care professional if you are unable to keep an appointment. What may interact with this medicine? -medicines for cancer chemotherapy -medicines that suppress your immune function -medicines that treat or prevent blood clots like warfarin, enoxaparin, and dalteparin -steroid medicines like prednisone or cortisone This list may not describe all possible interactions. Give your health care provider a list of all the medicines, herbs, non-prescription drugs, or dietary supplements you use. Also tell them if you smoke, drink alcohol, or use illegal drugs. Some items may interact with your medicine. What should I watch for while using this medicine? Mild fever and pain should go away in 3 days or less. Report any unusual symptoms to your doctor or health care professional. What side effects may I notice from receiving this medicine? Side effects that you should report to your doctor or health care professional as soon as possible: -allergic reactions like skin rash, itching or hives, swelling of the face, lips, or tongue -breathing problems -confused -fever over 102 degrees F -pain, tingling, numbness in the hands or feet -seizures -unusual bleeding or bruising -unusual muscle weakness Side effects that usually do not require medical attention (report to your doctor or health care professional if they continue or are  bothersome): -aches and pains -diarrhea -fever of 102 degrees F or less -headache -irritable -loss of appetite -pain, tender at site where injected -trouble sleeping This list may not describe all possible side effects. Call your doctor for medical advice about side effects. You may report side effects to FDA at 1-800-FDA-1088. Where should I keep my medicine? This does not apply. This vaccine is given in a clinic, pharmacy, doctor's office, or other health care setting and will not be stored at home. NOTE: This sheet is a summary. It may not cover all possible information. If you have questions about this medicine, talk to your doctor, pharmacist, or health care provider.  2019 Elsevier/Gold Standard (2008-02-28 14:32:37)  Pneumococcal Conjugate Vaccine suspension for injection What is this medicine? PNEUMOCOCCAL VACCINE (NEU mo KOK al vak SEEN) is a vaccine used to prevent pneumococcus bacterial infections. These bacteria can cause serious infections like pneumonia, meningitis, and blood infections. This vaccine will lower your chance of getting pneumonia. If you do get pneumonia, it can make your symptoms milder and your illness shorter. This vaccine will not treat an infection and will not cause infection. This vaccine is recommended for infants and young children, adults with certain medical conditions, and adults 69 years or older. This medicine may be used  for other purposes; ask your health care provider or pharmacist if you have questions. COMMON BRAND NAME(S): Prevnar, Prevnar 13 What should I tell my health care provider before I take this medicine? They need to know if you have any of these conditions: -bleeding problems -fever -immune system problems -an unusual or allergic reaction to pneumococcal vaccine, diphtheria toxoid, other vaccines, latex, other medicines, foods, dyes, or preservatives -pregnant or trying to get pregnant -breast-feeding How should I use this  medicine? This vaccine is for injection into a muscle. It is given by a health care professional. A copy of Vaccine Information Statements will be given before each vaccination. Read this sheet carefully each time. The sheet may change frequently. Talk to your pediatrician regarding the use of this medicine in children. While this drug may be prescribed for children as young as 43 weeks old for selected conditions, precautions do apply. Overdosage: If you think you have taken too much of this medicine contact a poison control center or emergency room at once. NOTE: This medicine is only for you. Do not share this medicine with others. What if I miss a dose? It is important not to miss your dose. Call your doctor or health care professional if you are unable to keep an appointment. What may interact with this medicine? -medicines for cancer chemotherapy -medicines that suppress your immune function -steroid medicines like prednisone or cortisone This list may not describe all possible interactions. Give your health care provider a list of all the medicines, herbs, non-prescription drugs, or dietary supplements you use. Also tell them if you smoke, drink alcohol, or use illegal drugs. Some items may interact with your medicine. What should I watch for while using this medicine? Mild fever and pain should go away in 3 days or less. Report any unusual symptoms to your doctor or health care professional. What side effects may I notice from receiving this medicine? Side effects that you should report to your doctor or health care professional as soon as possible: -allergic reactions like skin rash, itching or hives, swelling of the face, lips, or tongue -breathing problems -confused -fast or irregular heartbeat -fever over 102 degrees F -seizures -unusual bleeding or bruising -unusual muscle weakness Side effects that usually do not require medical attention (report to your doctor or health care  professional if they continue or are bothersome): -aches and pains -diarrhea -fever of 102 degrees F or less -headache -irritable -loss of appetite -pain, tender at site where injected -trouble sleeping This list may not describe all possible side effects. Call your doctor for medical advice about side effects. You may report side effects to FDA at 1-800-FDA-1088. Where should I keep my medicine? This does not apply. This vaccine is given in a clinic, pharmacy, doctor's office, or other health care setting and will not be stored at home. NOTE: This sheet is a summary. It may not cover all possible information. If you have questions about this medicine, talk to your doctor, pharmacist, or health care provider.  2019 Elsevier/Gold Standard (2014-04-30 10:27:27)

## 2018-08-29 NOTE — Progress Notes (Signed)
Chief Complaint  Patient presents with  . New Patient (Initial Visit)   New patient  1. 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 Name x 1 year per pt  2. Cervicalgia and pain with ROM and numbness down left arm and L shoulder pain  3. H/o psvt/HTN-f/u with Dr. Rockey Situ on Sotalol prev ACEI she could not tolerate for BP BP 136/90 today  4. Hypothyroidism on levothyroxine 75 mcg qd TSH elevated 4.970 06/12/18  5. Anxiety on celexa 20 mg qd   Review of Systems  Constitutional: Negative for weight loss.  HENT: Negative for hearing loss.   Eyes: Negative for blurred vision.  Respiratory: Negative for shortness of breath.   Cardiovascular: Negative for chest pain.  Gastrointestinal: Negative for abdominal pain.  Genitourinary:       +hot flashes    Musculoskeletal: Positive for joint pain and neck pain.  Skin: Negative for rash.  Neurological: Positive for tingling and sensory change. Negative for headaches.  Endo/Heme/Allergies:       +left underarm pain    Psychiatric/Behavioral: The patient is nervous/anxious.    Past Medical History:  Diagnosis Date  . BRCA negative 2016   MyRisk neg  . 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   . Cancer ([place]) 2006   pT2,N2a,Mx; ER/PR negative, Her 2 neu: 3+ ([facility]). Dermal lymphatic involvement, 9/ 22 nodes positive with extranodal extension.  Treated with Cytoxan, Adriamycin, Taxol and Herceptin.  . H/O cystitis   . Hemorrhoids   . Hx of migraines   . Hyperlipidemia   . Hypothyroid   . Osteopenia 11/2017   DEXA at [facility]  . Palpitations   . Paroxysmal  supraventricular tachycardia ([place])   . Personal history of tobacco use, presenting hazards to health   . Unspecified essential hypertension   . Unspecified hypothyroidism    Past Surgical History:  Procedure Laterality Date  . BREAST BIOPSY Left 2006   stereo breast bx  . BREAST RECONSTRUCTION Bilateral 06/08/14  . [place]   x 2  . COLONOSCOPY  2007   Dr. Allen Norris  . MASTECTOMY Left 2006   left breast  . MASTECTOMY  2015   right breast    Family History  Problem Relation Age of Onset  . Cancer Other        breast cancer  . Hypertension Mother    Social History   Socioeconomic History  . Marital status: Married    Spouse name: Not on file  . Number of children: Not on file  . Years of education: Not on file  . Highest education level: Not on file  Occupational History  . Not on file  Social Needs  . Financial resource strain: Not on file  . Food insecurity:    Worry: Not on file    Inability: Not on file  . Transportation needs:    Medical: Not on file    Non-medical: Not on file  Tobacco Use  . Smoking status: Former Smoker    Packs/day: 1.00    Years: 10.00  Pack years: 10.00    Types: Cigarettes    Last attempt to quit: 09/18/1978    Years since quitting: 39.9  . Smokeless tobacco: Never Used  Substance and Sexual Activity  . Alcohol use: Yes    Comment: bottle a wine weekly  . Drug use: No  . Sexual activity: Not on file  Lifestyle  . Physical activity:    Days per week: Not on file    Minutes per session: Not on file  . Stress: Not on file  Relationships  . Social connections:    Talks on phone: Not on file    Gets together: Not on file    Attends religious service: Not on file    Active member of club or organization: Not on file    Attends meetings of clubs or organizations: Not on file    Relationship status: Not on file  . Intimate partner violence:    Fear of current or ex partner: Not on file    Emotionally abused: Not  on file    Physically abused: Not on file    Forced sexual activity: Not on file  Other Topics Concern  . 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    Married    No guns, wears seat belt, safe in relationship    2 kids    Former smoker    Current Meds  Medication Sig  . Cholecalciferol (VITAMIN D PO) Take 1 tablet by mouth daily. 2000 IU daily  . citalopram (CELEXA) 20 MG tablet Take 1 tablet (20 mg total) by mouth daily.  Marland Kitchen levothyroxine (SYNTHROID, LEVOTHROID) 75 MCG tablet TAKE ONE TABLET BY MOUTH ONCE DAILY, TAKE ON AN EMPTY STOMACH AND WAIT ONE HOUR BEFORE EATING 30 minutes before  . simvastatin (ZOCOR) 40 MG tablet Take 1 tablet (40 mg total) by mouth daily at 6 PM.  . SOTALOL AF 80 MG TABS Take 0.5 tablets (40 mg total) by mouth 2 (two) times daily. (Patient taking differently: Take 40 mg by mouth. Pt is taking 0.5 Mg once daily)  . [DISCONTINUED] citalopram (CELEXA) 20 MG tablet Take 1 tablet (20 mg total) by mouth daily.  . [DISCONTINUED] levothyroxine (SYNTHROID, LEVOTHROID) 75 MCG tablet TAKE ONE TABLET BY MOUTH ONCE DAILY, TAKE ON AN EMPTY STOMACH AND WAIT ONE HOUR BEFORE EATING  . [DISCONTINUED] simvastatin (ZOCOR) 40 MG tablet Take 1 tablet (40 mg total) by mouth every evening.   Allergies  Allergen Reactions  . Enalapril     Cough   . Other Other (See Comments)    Medications ending in 'pril'; such as ace inhibitors   No results found for this or any previous visit (from the past 2160 hour(s)). Objective  Body mass index is 29.52 kg/m. Wt Readings from Last 3 Encounters:  08/29/18 177 lb 6.4 oz (80.5 kg)  07/02/18 180 lb 6.4 oz (81.8 kg)  09/11/17 172 lb (78 kg)   Temp Readings from Last 3 Encounters:  08/29/18 99 F (37.2 C) (Oral)  07/02/18 97.9 F (36.6 C) (Temporal)   BP Readings from Last 3 Encounters:  08/29/18 136/90  07/02/18 (!) 145/86  09/11/17 138/80   Pulse Readings from Last 3 Encounters:  08/29/18 72  07/02/18 79    09/11/17 80    Physical Exam Vitals signs and nursing note reviewed.  Constitutional:      Appearance: Normal appearance. She is well-developed, well-groomed and overweight.  HENT:  Head: Normocephalic and atraumatic.     Nose: Nose normal.     Mouth/Throat:     Mouth: Mucous membranes are moist.     Pharynx: Oropharynx is clear.  Eyes:     Conjunctiva/sclera: Conjunctivae normal.     Pupils: Pupils are equal, round, and reactive to light.  Cardiovascular:     Rate and Rhythm: Normal rate and regular rhythm.     Heart sounds: Normal heart sounds. No murmur.  Pulmonary:     Effort: Pulmonary effort is normal.     Breath sounds: Normal breath sounds.  Chest:       Comments: Left axillary ttp 7-8 oclock   Musculoskeletal:     Cervical back: She exhibits tenderness and pain. She exhibits normal range of motion.       Back:  Lymphadenopathy:     Upper Body:     Left upper body: No axillary adenopathy.  Skin:    General: Skin is warm and dry.  Neurological:     General: No focal deficit present.     Mental Status: She is alert and oriented to person, place, and time. Mental status is at baseline.     Gait: Gait normal.  Psychiatric:        Attention and Perception: Attention and perception normal.        Mood and Affect: Mood and affect normal.        Speech: Speech normal.        Behavior: Behavior normal. Behavior is cooperative.        Thought Content: Thought content normal.        Cognition and Memory: Cognition and memory normal.        Judgment: Judgment normal.     Assessment   1. H/o breast cancer DCIS in 2006 and 2015 s/p double mastectomy (left breast 2006, right breast 2015) with reconstruction at [facility] in 2017 presenting with left axillary pain at about 7-8 oclock  -of note she had chemo, radiation in the past, and took hormonal pill ? Name x 1 year per pt  2. Cervicalgia and pain with ROM and numbness/tingling down left arm and L shoulder pain  3.  H/o psvt/HTN likely though pt reports with MD visits elevated/hld 4. Hypothyroidism  5. Anxiety  6. HM  Plan  1.  Ordered PET scan and [place] left axilla  Will have pt f/u with h/o in future  She also f/u Dr. Bary Castilla  2.  Xray neck mild to mod DDD and left shoulder today neg though notes apical thickening in lungs pt does have h/o radiation  Consider MRI cervical in future  3.  Cont meds and f/u cards  Monitor bp consider thiazide vs arb vs CCB in future  Refilled zocor  4.  Levo 75 mcg qd  Will need repeat TSH in future  5.  On celexa continue  6.  Flu shot utd 05/07/18  Consider Tdap, shingrix, prevnar, pna 23 in future   Reviewed labs 06/12/18 CMET nl phos 4.6 sl high 4.5 normal, tc 164, tg 104, HDL 51, LDL 92, TSH 4.970 (elevated nl 4.50, CBC 4.3/13.6/39.2/242, CA 125 6.1, vitamin D 39.1, CA 27.29 11.3   Given [facility] form to get UA, MMR, hep B/C checked. May need to add on repeat TSH when labs result   S/p b/l mastectomy  dexa 11/12/17 osteopenia on vitamin D3 2000 IU daily  Colonoscopy 02/11/14 normal repeat in 10 years  Pap [facility] 09/11/17 negative  neg HPV   Eye MD Patty vision  Dentist [facility] dental  Surgery Dr. Bary Castilla  Dermatology Dr. Kellie Moor h/o [facility] or [facility] f/u in 6 months  Provider: Dr. Olivia Mackie McLean-Scocuzza-Internal Medicine

## 2018-08-29 NOTE — Telephone Encounter (Signed)
Attempted to return call to pt. to discuss xray results. Left message to return call to office.  See result note.

## 2018-08-29 NOTE — Telephone Encounter (Signed)
Copied from [place] [id number]. Topic: Quick Communication - Lab Results (Clinic Use ONLY) >> Aug 29, 2018  1:54 PM Babs Bertin, CMA wrote: Called patient to inform them of [id number] lab results. When patient returns call, triage nurse may disclose results.

## 2018-08-29 NOTE — Telephone Encounter (Signed)
Patient returning call for results. NOD currently unavailable. Please advise.

## 2018-08-30 NOTE — Telephone Encounter (Signed)
Confirm appt cx

## 2018-09-01 ENCOUNTER — Encounter: Payer: Self-pay | Admitting: Internal Medicine

## 2018-09-01 DIAGNOSIS — M542 Cervicalgia: Secondary | ICD-10-CM

## 2018-09-01 DIAGNOSIS — M79622 Pain in left upper arm: Secondary | ICD-10-CM | POA: Insufficient documentation

## 2018-09-01 DIAGNOSIS — Z853 Personal history of malignant neoplasm of breast: Secondary | ICD-10-CM | POA: Insufficient documentation

## 2018-09-01 DIAGNOSIS — M25512 Pain in left shoulder: Secondary | ICD-10-CM

## 2018-09-01 DIAGNOSIS — G8929 Other chronic pain: Secondary | ICD-10-CM | POA: Insufficient documentation

## 2018-09-01 HISTORY — DX: Pain in left upper arm: M79.622

## 2018-09-01 HISTORY — DX: Cervicalgia: M54.2

## 2018-09-02 DIAGNOSIS — R232 Flushing: Secondary | ICD-10-CM | POA: Insufficient documentation

## 2018-09-02 HISTORY — DX: Flushing: R23.2

## 2018-09-02 LAB — HM HEPATITIS C SCREENING LAB: HM HEPATITIS C SCREENING: NEGATIVE

## 2018-09-03 ENCOUNTER — Ambulatory Visit
Admission: RE | Admit: 2018-09-03 | Discharge: 2018-09-03 | Disposition: A | Discharge status: Home / Self Care | Payer: BLUE CROSS/BLUE SHIELD | Source: Ambulatory Visit | Attending: Internal Medicine | Admitting: Internal Medicine

## 2018-09-03 DIAGNOSIS — M79622 Pain in left upper arm: Secondary | ICD-10-CM | POA: Diagnosis not present

## 2018-09-03 DIAGNOSIS — I6523 Occlusion and stenosis of bilateral carotid arteries: Secondary | ICD-10-CM | POA: Diagnosis not present

## 2018-09-04 ENCOUNTER — Other Ambulatory Visit: Payer: Self-pay | Admitting: Internal Medicine

## 2018-09-04 DIAGNOSIS — E039 Hypothyroidism, unspecified: Secondary | ICD-10-CM

## 2018-09-04 MED ORDER — LEVOTHYROXINE SODIUM 75 MCG PO TABS: ORAL_TABLET | ORAL | 3 refills | Status: DC

## 2018-09-06 ENCOUNTER — Telehealth: Payer: Self-pay | Admitting: Internal Medicine

## 2018-09-06 NOTE — Telephone Encounter (Signed)
Labs from 09/02/2018  Urine normal  MMR protected   rec hep b vaccine new x 2 doses  TSH normal 1.710 free T4 normal 1.43  No hepatitis B or C    TMS

## 2018-09-09 ENCOUNTER — Encounter: Payer: Self-pay | Admitting: Internal Medicine

## 2018-09-11 NOTE — Telephone Encounter (Signed)
Patient was informed of results.  Patient understood and no questions, comments, or concerns at this time.  

## 2018-09-12 ENCOUNTER — Ambulatory Visit
Admission: RE | Admit: 2018-09-12 | Discharge: 2018-09-12 | Disposition: A | Discharge status: Home / Self Care | Payer: BLUE CROSS/BLUE SHIELD | Source: Ambulatory Visit | Attending: Internal Medicine | Admitting: Internal Medicine

## 2018-09-12 DIAGNOSIS — M79622 Pain in left upper arm: Secondary | ICD-10-CM | POA: Diagnosis not present

## 2018-09-12 DIAGNOSIS — I2510 Atherosclerotic heart disease of native coronary artery without angina pectoris: Secondary | ICD-10-CM | POA: Diagnosis not present

## 2018-09-12 DIAGNOSIS — Z9013 Acquired absence of bilateral breasts and nipples: Secondary | ICD-10-CM | POA: Diagnosis not present

## 2018-09-12 DIAGNOSIS — I700 Atherosclerosis of aorta: Secondary | ICD-10-CM | POA: Insufficient documentation

## 2018-09-12 DIAGNOSIS — Z853 Personal history of malignant neoplasm of breast: Secondary | ICD-10-CM | POA: Diagnosis not present

## 2018-09-12 DIAGNOSIS — C50919 Malignant neoplasm of unspecified site of unspecified female breast: Secondary | ICD-10-CM | POA: Diagnosis not present

## 2018-09-12 LAB — GLUCOSE, CAPILLARY: GLUCOSE-CAPILLARY: 99 mg/dL (ref 70–99)

## 2018-09-12 MED ORDER — FLUDEOXYGLUCOSE F - 18 (FDG) INJECTION
10.2300 | Freq: Once | INTRAVENOUS | Status: AC | PRN
  Administered 2018-09-12: 10.23 via INTRAVENOUS

## 2018-09-16 ENCOUNTER — Ambulatory Visit: Payer: BLUE CROSS/BLUE SHIELD | Admitting: Obstetrics and Gynecology

## 2018-09-25 ENCOUNTER — Inpatient Hospital Stay: Payer: BLUE CROSS/BLUE SHIELD | Admitting: Oncology

## 2018-10-15 ENCOUNTER — Ambulatory Visit: Payer: Self-pay | Admitting: Family Medicine

## 2018-10-22 ENCOUNTER — Encounter: Payer: Self-pay | Admitting: Internal Medicine

## 2018-10-22 ENCOUNTER — Other Ambulatory Visit: Payer: Self-pay

## 2018-10-22 ENCOUNTER — Ambulatory Visit: Payer: BLUE CROSS/BLUE SHIELD | Admitting: Internal Medicine

## 2018-10-22 VITALS — BP 146/92 | HR 70 | Temp 98.5°F | Ht 65.0 in | Wt 178.0 lb

## 2018-10-22 DIAGNOSIS — I2510 Atherosclerotic heart disease of native coronary artery without angina pectoris: Secondary | ICD-10-CM

## 2018-10-22 DIAGNOSIS — I10 Essential (primary) hypertension: Secondary | ICD-10-CM | POA: Diagnosis not present

## 2018-10-22 DIAGNOSIS — M5412 Radiculopathy, cervical region: Secondary | ICD-10-CM | POA: Diagnosis not present

## 2018-10-22 DIAGNOSIS — M5030 Other cervical disc degeneration, unspecified cervical region: Secondary | ICD-10-CM | POA: Diagnosis not present

## 2018-10-22 MED ORDER — OLMESARTAN MEDOXOMIL 20 MG PO TABS: 20.0000 mg | ORAL_TABLET | Freq: Every day | ORAL | 3 refills | Status: DC

## 2018-10-22 MED ORDER — OLMESARTAN MEDOXOMIL 20 MG PO TABS: 20.0000 mg | ORAL_TABLET | Freq: Every day | ORAL | 0 refills | Status: DC

## 2018-10-22 NOTE — Patient Instructions (Signed)
Aspirin 81 mg daily please take it  Take Benicar 20 mg daily for elevated blood pressure   Neck Exercises Neck exercises can be important for many reasons:  They can help you to improve and maintain flexibility in your neck. This can be especially important as you age.  They can help to make your neck stronger. This can make movement easier.  They can reduce or prevent neck pain.  They may help your upper back. Ask your health care provider which neck exercises would be best for you. Exercises to improve neck flexibility Neck stretch Repeat this exercise 3-5 times. 1. Do this exercise while standing or while sitting in a chair. 2. Place your feet flat on the floor, shoulder-width apart. 3. Slowly turn your head to the right. Turn it all the way to the right so you can look over your right shoulder. Do not tilt or tip your head. 4. Hold this position for 10-30 seconds. 5. Slowly turn your head to the left, to look over your left shoulder. 6. Hold this position for 10-30 seconds.  Neck retraction Repeat this exercise 8-10 times. Do this 3-4 times a day or as told by your health care provider. 1. Do this exercise while standing or while sitting in a sturdy chair. 2. Look straight ahead. Do not bend your neck. 3. Use your fingers to push your chin backward. Do not bend your neck for this movement. Continue to face straight ahead. If you are doing the exercise properly, you will feel a slight sensation in your throat and a stretch at the back of your neck. 4. Hold the stretch for 1-2 seconds. Relax and repeat. Exercises to improve neck strength Neck press Repeat this exercise 10 times. Do it first thing in the morning and right before bed or as told by your health care provider. 1. Lie on your back on a firm bed or on the floor with a pillow under your head. 2. Use your neck muscles to push your head down on the pillow and straighten your spine. 3. Hold the position as well as you can.  Keep your head facing up and your chin tucked. 4. Slowly count to 5 while holding this position. 5. Relax for a few seconds. Then repeat. Isometric strengthening Do a full set of these exercises 2 times a day or as told by your health care provider. 1. Sit in a supportive chair and place your hand on your forehead. 2. Push forward with your head and neck while pushing back with your hand. Hold for 10 seconds. 3. Relax. Then repeat the exercise 3 times. 4. Next, do thesequence again, this time putting your hand against the back of your head. Use your head and neck to push backward against the hand pressure. 5. Finally, do the same exercise on either side of your head, pushing sideways against the pressure of your hand. Prone head lifts Repeat this exercise 5 times. Do this 2 times a day or as told by your health care provider. 1. Lie face-down, resting on your elbows so that your chest and upper back are raised. 2. Start with your head facing downward, near your chest. Position your chin either on or near your chest. 3. Slowly lift your head upward. Lift until you are looking straight ahead. Then continue lifting your head as far back as you can stretch. 4. Hold your head up for 5 seconds. Then slowly lower it to your starting position. Supine head lifts Repeat this  exercise 8-10 times. Do this 2 times a day or as told by your health care provider. 1. Lie on your back, bending your knees to point to the ceiling and keeping your feet flat on the floor. 2. Lift your head slowly off the floor, raising your chin toward your chest. 3. Hold for 5 seconds. 4. Relax and repeat. Scapular retraction Repeat this exercise 5 times. Do this 2 times a day or as told by your health care provider. 1. Stand with your arms at your sides. Look straight ahead. 2. Slowly pull both shoulders backward and downward until you feel a stretch between your shoulder blades in your upper back. 3. Hold for 10-30 seconds.  4. Relax and repeat. Contact a health care provider if:  Your neck pain or discomfort gets much worse when you do an exercise.  Your neck pain or discomfort does not improve within 2 hours after you exercise. If you have any of these problems, stop exercising right away. Do not do the exercises again unless your health care provider says that you can. Get help right away if:  You develop sudden, severe neck pain. If this happens, stop exercising right away. Do not do the exercises again unless your health care provider says that you can. This information is not intended to replace advice given to you by your health care provider. Make sure you discuss any questions you have with your health care provider. Document Released: 07/05/2015 Document Revised: 11/27/2017 Document Reviewed: 02/01/2015 Elsevier Interactive Patient Education  2019 [employer]  Cervical Radiculopathy  Cervical radiculopathy happens when a nerve in the neck (cervical nerve) is pinched or bruised. This condition can develop because of an injury or as part of the normal aging process. Pressure on the cervical nerves can cause pain or numbness that runs from the neck all the way down into the arm and fingers. Usually, this condition gets better with rest. Treatment may be needed if the condition does not improve. What are the causes? This condition may be caused by:  Injury.  Slipped (herniated) disk.  Muscle tightness in the neck because of overuse.  Arthritis.  Breakdown or degeneration in the bones and joints of the spine (spondylosis) due to aging.  Bone spurs that may develop near the cervical nerves. What are the signs or symptoms? Symptoms of this condition include:  Pain that runs from the neck to the arm and hand. The pain can be severe or irritating. It may be worse when the neck is moved.  Numbness or weakness in the affected arm and hand. How is this diagnosed? This condition may be diagnosed  based on symptoms, medical history, and a physical exam. You may also have tests, including:  X-rays.  CT scan.  MRI.  Electromyogram (EMG).  Nerve conduction tests. How is this treated? In many cases, treatment is not needed for this condition. With rest, the condition usually gets better over time. If treatment is needed, options may include:  Wearing a soft neck collar for short periods of time.  Physical therapy to strengthen your neck muscles.  Medicines, such as NSAIDs, oral corticosteroids, or spinal injections.  Surgery. This may be needed if other treatments do not help. Various types of surgery may be done depending on the cause of your problems. Follow these instructions at home: Managing pain  Take over-the-counter and prescription medicines only as told by your health care provider.  If directed, apply ice to the affected area. ? Put ice  in a plastic bag. ? Place a towel between your skin and the bag. ? Leave the ice on for 20 minutes, 2-3 times per day.  If ice does not help, you can try using heat. Take a warm shower or warm bath, or use a heat pack as told by your health care provider.  Try a gentle neck and shoulder massage to help relieve symptoms. Activity  Rest as needed. Follow instructions from your health care provider about any restrictions on activities.  Do stretching and strengthening exercises as told by your health care provider or physical therapist. General instructions  If you were given a soft collar, wear it as told by your health care provider.  Use a flat pillow when you sleep.  Keep all follow-up visits as told by your health care provider. This is important. Contact a health care provider if:  Your condition does not improve with treatment. Get help right away if:  Your pain gets much worse and cannot be controlled with medicines.  You have weakness or numbness in your hand, arm, face, or leg.  You have a high fever.  You  have a stiff, rigid neck.  You lose control of your bowels or your bladder (have incontinence).  You have trouble with walking, balance, or speaking. This information is not intended to replace advice given to you by your health care provider. Make sure you discuss any questions you have with your health care provider. Document Released: 04/18/2001 Document Revised: 12/30/2015 Document Reviewed: 09/17/2014 Elsevier Interactive Patient Education  [employer].

## 2018-10-22 NOTE — Progress Notes (Signed)
Pre visit review using our clinic review tool, if applicable. No additional management support is needed unless otherwise documented below in the visit note. 

## 2018-10-22 NOTE — Progress Notes (Signed)
Chief Complaint  Patient presents with  . Follow-up   F/u  1. C/o numbness/tingling down left arm and Xray with DDD mild to moderate dz nothing tried sxs long term  2. HTN not on any meds prev on benicar 20 mg  3. H/o breast cancer reviewed PET scan neg  4. CAD/atherosclerosis and pSVT controlled needs to sch with cards Dr. Rockey Situ    Review of Systems  Constitutional: Negative for weight loss.  HENT: Negative for hearing loss.   Eyes: Negative for blurred vision.  Respiratory: Negative for shortness of breath.   Cardiovascular: Negative for chest pain.  Gastrointestinal: Negative for abdominal pain.  Musculoskeletal: Positive for neck pain.  Skin: Negative for rash.  Neurological: Positive for sensory change. Negative for headaches.   Past Medical History:  Diagnosis Date  . BRCA negative 2016   MyRisk neg  . 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   . Cancer ([place]) 2006   pT2,N2a,Mx; ER/PR negative, Her 2 neu: 3+ ([facility]). Dermal lymphatic involvement, 9/ 22 nodes positive with extranodal extension.  Treated with Cytoxan, Adriamycin, Taxol and Herceptin.  . H/O cystitis   . Hemorrhoids   . Hot flashes   . Hx of migraines   . Hyperlipidemia   . Hypothyroid   . Osteopenia 11/2017   DEXA at [facility]  . Palpitations   . Paroxysmal supraventricular tachycardia ([place])   . Personal history of tobacco use, presenting hazards to health   . Skin cancer    nmsc Dr. Kellie Moor   . Unspecified essential hypertension   . Unspecified hypothyroidism    Past Surgical History:  Procedure Laterality Date  . BREAST BIOPSY Left 2006   stereo breast bx  . BREAST RECONSTRUCTION Bilateral 06/08/14  . [place]   x 2  . COLONOSCOPY  2007   Dr. Allen Norris  . MASTECTOMY Left 2006   left breast  . MASTECTOMY  2015   right breast    Family History  Problem Relation Age of Onset  . Cancer Other        breast cancer  .  Hypertension Mother    Social History   Socioeconomic History  . Marital status: Married    Spouse name: Not on file  . Number of children: Not on file  . Years of education: Not on file  . Highest education level: Not on file  Occupational History  . Not on file  Social Needs  . Financial resource strain: Not on file  . Food insecurity:    Worry: Not on file    Inability: Not on file  . Transportation needs:    Medical: Not on file    Non-medical: Not on file  Tobacco Use  . Smoking status: Former Smoker    Packs/day: 1.00    Years: 10.00    Pack years: 10.00    Types: Cigarettes    Last attempt to quit: 09/18/1978    Years since quitting: 40.1  . Smokeless tobacco: Never Used  Substance and Sexual Activity  . Alcohol use: Yes    Comment: bottle a wine weekly  . Drug use: No  . Sexual activity: Not on file  Lifestyle  . Physical activity:    Days per week: Not on file    Minutes per session: Not on file  . Stress: Not on file  Relationships  . Social connections:    Talks on phone: Not  on file    Gets together: Not on file    Attends religious service: Not on file    Active member of club or organization: Not on file    Attends meetings of clubs or organizations: Not on file    Relationship status: Not on file  . Intimate partner violence:    Fear of current or ex partner: Not on file    Emotionally abused: Not on file    Physically abused: Not on file    Forced sexual activity: Not on file  Other Topics Concern  . 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    Married    No guns, wears seat belt, safe in relationship    2 kids    Former smoker    Current Meds  Medication Sig  . Cholecalciferol (VITAMIN D PO) Take 1 tablet by mouth daily. 2000 IU daily  . citalopram (CELEXA) 20 MG tablet Take 1 tablet (20 mg total) by mouth daily.  . [facility] CALCIUM PO Take by mouth.  . cyanocobalamin 2000 MCG tablet Take 2,000 mcg by mouth daily.  Marland Kitchen  levothyroxine (SYNTHROID, LEVOTHROID) 75 MCG tablet TAKE ONE TABLET BY MOUTH ONCE DAILY, TAKE ON AN EMPTY STOMACH AND WAIT 30 minutes before food  . simvastatin (ZOCOR) 40 MG tablet Take 1 tablet (40 mg total) by mouth daily at 6 PM.  . SOTALOL AF 80 MG TABS Take 0.5 tablets (40 mg total) by mouth 2 (two) times daily. (Patient taking differently: Take 40 mg by mouth. Pt is taking 0.5 Mg once daily)   Allergies  Allergen Reactions  . Enalapril     Cough   . Other Other (See Comments)    Medications ending in 'pril'; such as ace inhibitors   Recent Results (from the past 2160 hour(s))  HM HEPATITIS C SCREENING LAB     Status: None   Collection Time: 09/02/18 12:00 AM  Result Value Ref Range   HM Hepatitis Screen Negative-Validated   Glucose, capillary     Status: None   Collection Time: 09/12/18  8:20 AM  Result Value Ref Range   Glucose-Capillary 99 70 - 99 mg/dL   Objective  Body mass index is 29.62 kg/m. Wt Readings from Last 3 Encounters:  10/22/18 178 lb (80.7 kg)  08/29/18 177 lb 6.4 oz (80.5 kg)  07/02/18 180 lb 6.4 oz (81.8 kg)   Temp Readings from Last 3 Encounters:  10/22/18 98.5 F (36.9 C) (Oral)  08/29/18 99 F (37.2 C) (Oral)  07/02/18 97.9 F (36.6 C) (Temporal)   BP Readings from Last 3 Encounters:  10/22/18 (!) 150/84  08/29/18 136/90  07/02/18 (!) 145/86   Pulse Readings from Last 3 Encounters:  10/22/18 70  08/29/18 72  07/02/18 79    Physical Exam Vitals signs and nursing note reviewed.  Constitutional:      Appearance: Normal appearance. She is well-developed and well-groomed.  HENT:     Head: Normocephalic and atraumatic.     Nose: Nose normal.     Mouth/Throat:     Mouth: Mucous membranes are moist.     Pharynx: Oropharynx is clear.  Eyes:     Conjunctiva/sclera: Conjunctivae normal.     Pupils: Pupils are equal, round, and reactive to light.  Cardiovascular:     Rate and Rhythm: Normal rate and regular rhythm.     Heart sounds:  Normal heart sounds. No murmur.  Pulmonary:  Effort: Pulmonary effort is normal.     Breath sounds: Normal breath sounds.  Neurological:     General: No focal deficit present.     Mental Status: She is alert and oriented to person, place, and time. Mental status is at baseline.     Gait: Gait normal.  Psychiatric:        Attention and Perception: Attention and perception normal.        Mood and Affect: Mood and affect normal.        Speech: Speech normal.        Behavior: Behavior normal. Behavior is cooperative.        Thought Content: Thought content normal.        Cognition and Memory: Cognition and memory normal.        Judgment: Judgment normal.     Assessment   1. Cervical radiculopathy mild to mod DDD with numbness tingling radicular sxs  2. HTN  3. H/o breast cancer PET negative  4. CAD/atherosclerosis, pSVT 5. HM Plan   1. MRI C spine  2. Add benicar 20 mg qd  3. F/u Dr. Bary Castilla fall 2020  4. rec aspirin 81 mg qd  F/u Dr. Rockey Situ  5.  Flu shot utd 05/07/18  Consider Tdap, shingrix, prevnar, pna 23 in future  MMR immune  rec hep B and neg hep C  TSH nl 08/2018   Reviewed labs 06/12/18 CMET nl phos 4.6 sl high 4.5 normal, tc 164, tg 104, HDL 51, LDL 92, TSH 4.970 (elevated nl 4.50, CBC 4.3/13.6/39.2/242, CA 125 6.1, vitamin D 39.1, CA 27.29 11.3   S/p b/l mastectomy  dexa 11/12/17 osteopenia on vitamin D3 2000 IU daily  Colonoscopy 02/11/14 normal repeat in 10 years  Pap [facility] 09/11/17 negative neg HPV   Eye MD [facility]  D[facility]  Surgery Dr. Bary Castilla  Dermatology Dr. Kellie Moor h/o SCC or NMSC f/u in 6 months   Urine normal 09/02/2018 MMR immune  rec hep B vaccine  No hep C  TSH nl 1.710   Provider: Dr. Olivia Mackie McLean-Scocuzza-Internal Medicine

## 2018-11-21 ENCOUNTER — Other Ambulatory Visit: Payer: Self-pay

## 2018-11-21 ENCOUNTER — Ambulatory Visit
Admission: RE | Admit: 2018-11-21 | Discharge: 2018-11-21 | Disposition: A | Discharge status: Home / Self Care | Payer: BLUE CROSS/BLUE SHIELD | Source: Ambulatory Visit | Attending: Internal Medicine | Admitting: Internal Medicine

## 2018-11-21 DIAGNOSIS — M5030 Other cervical disc degeneration, unspecified cervical region: Secondary | ICD-10-CM | POA: Insufficient documentation

## 2018-11-21 DIAGNOSIS — M5412 Radiculopathy, cervical region: Secondary | ICD-10-CM | POA: Insufficient documentation

## 2018-11-21 DIAGNOSIS — M47812 Spondylosis without myelopathy or radiculopathy, cervical region: Secondary | ICD-10-CM | POA: Diagnosis not present

## 2018-11-26 ENCOUNTER — Telehealth: Payer: Self-pay | Admitting: Internal Medicine

## 2018-11-26 NOTE — Telephone Encounter (Signed)
Patient doesn't need any refills.

## 2018-11-26 NOTE — Telephone Encounter (Signed)
Call pt does she need refill of meds to [facility] b/c I sent them at the same time to [facility] a while back so she should have refills of benicar,  is she using them [facility]e [facility] ?   [place]

## 2018-11-29 ENCOUNTER — Other Ambulatory Visit: Payer: BLUE CROSS/BLUE SHIELD

## 2018-12-23 ENCOUNTER — Ambulatory Visit: Payer: BLUE CROSS/BLUE SHIELD

## 2019-01-27 DIAGNOSIS — Z85828 Personal history of other malignant neoplasm of skin: Secondary | ICD-10-CM | POA: Diagnosis not present

## 2019-01-27 DIAGNOSIS — D2261 Melanocytic nevi of right upper limb, including shoulder: Secondary | ICD-10-CM | POA: Diagnosis not present

## 2019-01-27 DIAGNOSIS — D2262 Melanocytic nevi of left upper limb, including shoulder: Secondary | ICD-10-CM | POA: Diagnosis not present

## 2019-01-27 DIAGNOSIS — D2272 Melanocytic nevi of left lower limb, including hip: Secondary | ICD-10-CM | POA: Diagnosis not present

## 2019-02-28 ENCOUNTER — Encounter: Payer: Self-pay | Admitting: General Surgery

## 2019-04-07 DIAGNOSIS — H43812 Vitreous degeneration, left eye: Secondary | ICD-10-CM | POA: Diagnosis not present

## 2019-04-25 ENCOUNTER — Ambulatory Visit: Payer: BLUE CROSS/BLUE SHIELD | Admitting: Internal Medicine

## 2019-06-24 ENCOUNTER — Ambulatory Visit (INDEPENDENT_AMBULATORY_CARE_PROVIDER_SITE_OTHER): Payer: BC Managed Care – PPO | Admitting: Family Medicine

## 2019-06-24 ENCOUNTER — Ambulatory Visit
Admission: RE | Admit: 2019-06-24 | Discharge: 2019-06-24 | Disposition: A | Discharge status: Home / Self Care | Payer: BC Managed Care – PPO | Source: Ambulatory Visit | Attending: Family Medicine | Admitting: Family Medicine

## 2019-06-24 ENCOUNTER — Telehealth: Payer: Self-pay | Admitting: Internal Medicine

## 2019-06-24 ENCOUNTER — Other Ambulatory Visit: Payer: Self-pay

## 2019-06-24 ENCOUNTER — Encounter: Payer: Self-pay | Admitting: Family Medicine

## 2019-06-24 VITALS — Ht 65.0 in | Wt 178.0 lb

## 2019-06-24 DIAGNOSIS — R1031 Right lower quadrant pain: Secondary | ICD-10-CM | POA: Insufficient documentation

## 2019-06-24 DIAGNOSIS — K5792 Diverticulitis of intestine, part unspecified, without perforation or abscess without bleeding: Secondary | ICD-10-CM | POA: Diagnosis not present

## 2019-06-24 DIAGNOSIS — K5730 Diverticulosis of large intestine without perforation or abscess without bleeding: Secondary | ICD-10-CM | POA: Diagnosis not present

## 2019-06-24 MED ORDER — IOHEXOL 300 MG/ML  SOLN
100.0000 mL | Freq: Once | INTRAMUSCULAR | Status: AC | PRN
  Administered 2019-06-24: 14:00:00 100 mL via INTRAVENOUS

## 2019-06-24 MED ORDER — METRONIDAZOLE 500 MG PO TABS: 500.0000 mg | ORAL_TABLET | Freq: Two times a day (BID) | ORAL | 0 refills | Status: DC

## 2019-06-24 MED ORDER — CIPROFLOXACIN HCL 500 MG PO TABS: 500.0000 mg | ORAL_TABLET | Freq: Two times a day (BID) | ORAL | 0 refills | Status: DC

## 2019-06-24 NOTE — Progress Notes (Signed)
Patient ID: Adriana Cobb, female   DOB: 1950/11/22, 68 y.o.   MRN: [id number]    Virtual Visit via vid Note  This visit type was conducted due to national recommendations for restrictions regarding the COVID-19 pandemic (e.g. social distancing).  This format is felt to be most appropriate for this patient at this time.  All issues noted in this document were discussed and addressed.  No physical exam was performed (except for noted visual exam findings with Video Visits).   I connected with Taneesha Edgin today at  8:00 AM EST by a video enabled telemedicine application or telephone and verified that I am speaking with the correct person using two identifiers. Location patient: home Location provider: work or home office Persons participating in the virtual visit: patient, provider  I discussed the limitations, risks, security and privacy concerns of performing an evaluation and management service by video and the availability of in person appointments. I also discussed with the patient that there may be a patient responsible charge related to this service. The patient expressed understanding and agreed to proceed.   HPI:  Patient and I connected via video due to complaints of right lower quadrant abdominal pain.  Patient states the pain began on 06/17/2019.  Not sure if she pulled a muscle or if it is related to someone else.  States the pain waxes and wanes.  States it will feel achy and seems worse with movement.  No fever or chills.  No vomiting or diarrhea.  Appetite has been normal.  Last thing she ate was dinner last night.  Sipping on some coffee this a.m.  ROS: See pertinent positives and negatives per HPI.  Past Medical History:  Diagnosis Date  . BRCA negative 2016   MyRisk neg  . 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   . Cancer ([place]) 2006   pT2,N2a,Mx; ER/PR negative, Her 2 neu: 3+ ([facility]). Dermal lymphatic involvement, 9/  22 nodes positive with extranodal extension.  Treated with Cytoxan, Adriamycin, Taxol and Herceptin.  . H/O cystitis   . Hemorrhoids   . Hot flashes   . Hx of migraines   . Hyperlipidemia   . Hypothyroid   . Osteopenia 11/2017   [facility] at [facility]  . Palpitations   . Paroxysmal supraventricular tachycardia ([place])   . Personal history of tobacco use, presenting hazards to health   . Skin cancer    nmsc Dr. Kellie Moor   . Unspecified essential hypertension   . Unspecified hypothyroidism     Past Surgical History:  Procedure Laterality Date  . BREAST BIOPSY Left 2006   stereo breast bx  . BREAST RECONSTRUCTION Bilateral 06/08/14  . [place]   x 2  . COLONOSCOPY  2007   Dr. Allen Norris  . MASTECTOMY Left 2006   left breast  . MASTECTOMY  2015   right breast     Family History  Problem Relation Age of Onset  . Cancer Other        breast cancer  . Hypertension Mother    Social History   Tobacco Use  . Smoking status: Former Smoker    Packs/day: 1.00    Years: 10.00    Pack years: 10.00    Types: Cigarettes    Quit date: 09/18/1978    Years since quitting: 40.7  . Smokeless tobacco: Never Used  Substance Use Topics  . Alcohol use: Yes  Comment: bottle a wine weekly    Current Outpatient Medications:  .  Cholecalciferol (VITAMIN D PO), Take 1 tablet by mouth daily. 2000 IU daily, Disp: , Rfl:  .  citalopram (CELEXA) 20 MG tablet, Take 1 tablet (20 mg total) by mouth daily., Disp: 90 tablet, Rfl: 3 .  levothyroxine (SYNTHROID, LEVOTHROID) 75 MCG tablet, TAKE ONE TABLET BY MOUTH ONCE DAILY, TAKE ON AN EMPTY STOMACH AND WAIT 30 minutes before food, Disp: 90 tablet, Rfl: 3 .  olmesartan (BENICAR) 20 MG tablet, Take 1 tablet (20 mg total) by mouth daily., Disp: 90 tablet, Rfl: 3 .  simvastatin (ZOCOR) 40 MG tablet, Take 1 tablet (40 mg total) by mouth daily at 6 PM., Disp: 90 tablet, Rfl: 3 .  [facility] CALCIUM PO, Take by mouth., Disp: , Rfl:  .  cyanocobalamin 2000  MCG tablet, Take 2,000 mcg by mouth daily., Disp: , Rfl:   EXAM:  GENERAL: alert, oriented, appears well and in no acute distress  HEENT: atraumatic, conjunttiva clear, no obvious abnormalities on inspection of external nose and ears  NECK: normal movements of the head and neck  LUNGS: on inspection no signs of respiratory distress, breathing rate appears normal, no obvious gross SOB, gasping or wheezing  CV: no obvious cyanosis  MS: moves all visible extremities without noticeable abnormality  PSYCH/NEURO: pleasant and cooperative, no obvious depression or anxiety, speech and thought processing grossly intact  ASSESSMENT AND PLAN:  Discussed the following assessment and plan:  Right lower quadrant abdominal pain-unclear reason for this.  We will need to do further investigation..  She appears to be in no acute distress, but we need to rule out appendicitis due to location of her pain.  We will get scan done today.  Once appendicitis is ruled out we can move forward with other differential diagnoses such as pulled muscle in the right abdomen, inflammation/irritation in the intestines from something like mild gastritis, gas pain.   I discussed the assessment and treatment plan with the patient. The patient was provided an opportunity to ask questions and all were answered. The patient agreed with the plan and demonstrated an understanding of the instructions.   The patient was advised to call back or seek an in-person evaluation if the symptoms worsen or if the condition fails to improve as anticipated.  Jodelle Green, FNP

## 2019-06-24 NOTE — Addendum Note (Signed)
Addended by: Philis Nettle on: 06/24/2019 04:40 PM   Modules accepted: Orders

## 2019-06-24 NOTE — Telephone Encounter (Signed)
1 of liver enzymes slightly elevated ALT 36  -do you want me to order CT abdomen/pelvis to work this up or an ultrasound with h/o breast cancer rec CT   Cholesterol normal  Thyroid labs normal   Blood cts normal  A1c 5.3 no prediabetes   CA 125 cancer marker pending will let know if elevated  CA 27.29 normal   Vitamin D 33 rec D3 2000 IU daily

## 2019-06-25 ENCOUNTER — Encounter: Payer: Self-pay | Admitting: Internal Medicine

## 2019-06-25 NOTE — Telephone Encounter (Signed)
Patient was informed of results.  Patient understood and no questions, comments, or concerns at this time.  

## 2019-06-25 NOTE — Telephone Encounter (Signed)
Sarah --- please call  Her antibiotics were sent in yesterday  Also, yes to work note for rest of this week  LG

## 2019-07-01 ENCOUNTER — Other Ambulatory Visit: Payer: Self-pay

## 2019-07-01 ENCOUNTER — Encounter: Payer: Self-pay | Admitting: Surgery

## 2019-07-01 ENCOUNTER — Ambulatory Visit (INDEPENDENT_AMBULATORY_CARE_PROVIDER_SITE_OTHER): Payer: BC Managed Care – PPO | Admitting: Surgery

## 2019-07-01 VITALS — BP 152/85 | HR 73 | Temp 97.7°F | Resp 16 | Ht 66.0 in | Wt 181.8 lb

## 2019-07-01 DIAGNOSIS — Z86000 Personal history of in-situ neoplasm of breast: Secondary | ICD-10-CM

## 2019-07-01 DIAGNOSIS — Z853 Personal history of malignant neoplasm of breast: Secondary | ICD-10-CM | POA: Diagnosis not present

## 2019-07-01 NOTE — Progress Notes (Signed)
07/01/2019  History of Present Illness: Adriana Cobb is a 68 y.o. female status post left mastectomy with axillary lymph node dissection 2006 as well as right mastectomy and sentinel lymph node biopsy in January 2015.  She had bilateral breast reconstruction at [facility] on 06/08/2014.  No mammograms have been done since.  Patient presents today for yearly follow-up.  Patient reports that she has been doing very well from the breast standpoint but unfortunately she was hit with a bout of diverticulitis of the ascending colon from a giant diverticulum last week.  She had a CT scan of the abdomen pelvis on 11/17 which I have independently viewed and reviewed the images with the patient.  Today she feels much better and she is on antibiotics.  Past Medical History: Past Medical History:  Diagnosis Date  . BRCA negative 2016   MyRisk neg  . 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   . Cancer ([place]) 2006   pT2,N2a,Mx; ER/PR negative, Her 2 neu: 3+ ([facility]). Dermal lymphatic involvement, 9/ 22 nodes positive with extranodal extension.  Treated with Cytoxan, Adriamycin, Taxol and Herceptin.  . H/O cystitis   . Hemorrhoids   . Hot flashes   . Hx of migraines   . Hyperlipidemia   . Hypothyroid   . Osteopenia 11/2017   DEXA at [facility]  . Palpitations   . Paroxysmal supraventricular tachycardia ([place])   . Personal history of tobacco use, presenting hazards to health   . Skin cancer    nmsc Dr. Kellie Moor   . Unspecified essential hypertension   . Unspecified hypothyroidism      Past Surgical History: Past Surgical History:  Procedure Laterality Date  . BREAST BIOPSY Left 2006   stereo breast bx  . BREAST RECONSTRUCTION Bilateral 06/08/14  . [place]   x 2  . COLONOSCOPY  2007   Dr. Allen Norris  . MASTECTOMY Left 2006   left breast  . MASTECTOMY  2015   right breast     Home Medications: Prior to Admission medications    Medication Sig Start Date End Date Taking? Authorizing Provider  Cholecalciferol (VITAMIN D PO) Take 1 tablet by mouth daily. 2000 IU daily   Yes [provider]  ciprofloxacin (CIPRO) 500 MG tablet Take 1 tablet (500 mg total) by mouth 2 (two) times daily. 06/24/19  Yes Guse, Jacquelynn Cree, FNP  citalopram (CELEXA) 20 MG tablet Take 1 tablet (20 mg total) by mouth daily. 08/29/18  Yes McLean-Scocuzza, Nino Glow, MD  [facility] CALCIUM PO Take by mouth.   Yes [provider]  cyanocobalamin 2000 MCG tablet Take 2,000 mcg by mouth daily.   Yes [provider]  levothyroxine (SYNTHROID, LEVOTHROID) 75 MCG tablet TAKE ONE TABLET BY MOUTH ONCE DAILY, TAKE ON AN EMPTY STOMACH AND WAIT 30 minutes before food 09/04/18  Yes McLean-Scocuzza, Nino Glow, MD  metroNIDAZOLE (FLAGYL) 500 MG tablet Take 1 tablet (500 mg total) by mouth 2 (two) times daily. 06/24/19  Yes Guse, Jacquelynn Cree, FNP  olmesartan (BENICAR) 20 MG tablet Take 1 tablet (20 mg total) by mouth daily. 10/22/18  Yes McLean-Scocuzza, Nino Glow, MD  simvastatin (ZOCOR) 40 MG tablet Take 1 tablet (40 mg total) by mouth daily at 6 PM. 08/29/18  Yes McLean-Scocuzza, Nino Glow, MD    Allergies: Allergies  Allergen Reactions  . Enalapril     Cough   . Other Other (See Comments)  Medications ending in 'pril'; such as ace inhibitors    Review of Systems: Review of Systems  Constitutional: Negative for chills and fever.  Respiratory: Negative for shortness of breath.   Cardiovascular: Negative for chest pain.  Gastrointestinal: Negative for abdominal pain, nausea and vomiting.  Skin: Negative for rash.    Physical Exam BP (!) 152/85   Pulse 73   Temp 97.7 F (36.5 C) (Temporal)   Resp 16   Ht '5\' 6"'  (1.676 m)   Wt 181 lb 12.8 oz (82.5 kg)   SpO2 97%   BMI 29.34 kg/m  CONSTITUTIONAL: No acute distress HEENT:  Normocephalic, atraumatic, extraocular motion intact. RESPIRATORY:  Lungs are clear, and breath sounds are equal  bilaterally. Normal respiratory effort without pathologic use of accessory muscles. CARDIOVASCULAR: Heart is regular without murmurs, gallops, or rubs. BREAST: Right breast status post TRAM reconstruction with no palpable masses, skin changes.  There is no right axillary or supraclavicular lymphadenopathy.  Left breast status post TRAM reconstruction as well with no palpable masses or skin changes.  There is no left axillary or supraclavicular lymphadenopathy.  Right chest Port-A-Cath scar site well-healed. GI: The abdomen is soft, nondistended, nontender to palpation.  Patient has scars from her TRAM flap which are well-healed.  NEUROLOGIC:  Motor and sensation is grossly normal.  Cranial nerves are grossly intact. PSYCH:  Alert and oriented to person, place and time. Affect is normal.  Labs/Imaging: CT scan abdomen pelvis on 06/24/2019: IMPRESSION: Giant inflamed diverticulum in the ascending colon with extensive soft tissue reaction around the diverticulum consistent with acute Diverticulitis.  PET scan on 09/12/2018: IMPRESSION: 1. No findings of active malignancy. 2. Prior left axillary dissection prior bilateral breast reconstruction. 3. Chronic left apical pleuroparenchymal scarring, with some associated calcification but without hypermetabolic activity. 4.  Aortic Atherosclerosis (ICD10-I70.0).  Coronary atherosclerosis. 5. Chronic hypodense hepatic lesions, not hypermetabolic, chronic and believed to be benign lesions such as cysts or hemangiomas.  Assessment and Plan: This is a 68 y.o. female status post bilateral mastectomies in 2006 in 2015 with subsequent reconstruction and November 2015. -Patient's been doing well from the breast standpoint with no evidence of recurrence at this point.  From the abdomen standpoint, she is feeling better now that she is on antibiotics and has not had any other issues. -Discussed with the patient that she is now 5 years out from her last cancer  surgery and she has been doing very well with no evidence of any recurrence on her exams.  She also had a PET scan on 09/12/2018 which was negative.  I think at this point is very appropriate to step back from a surgical standpoint and defer further exams to the patient's PCP.  I discussed with the patient that of course if there is anything suspicious on her exams for any concerns that she may have, we would be happy to see her again and be of any help that is needed. -Follow-up with [place] as needed.  Face-to-face time spent with the patient and care providers was 25 minutes, with more than 50% of the time spent counseling, educating, and coordinating care of the patient.     Melvyn Neth, [facility]

## 2019-07-01 NOTE — Patient Instructions (Signed)
   Follow-up with our office as needed.  Please call and ask to speak with a nurse if you develop questions or concerns.  

## 2019-07-09 ENCOUNTER — Ambulatory Visit (INDEPENDENT_AMBULATORY_CARE_PROVIDER_SITE_OTHER): Payer: BC Managed Care – PPO | Admitting: Internal Medicine

## 2019-07-09 ENCOUNTER — Encounter: Payer: Self-pay | Admitting: Internal Medicine

## 2019-07-09 VITALS — Ht 66.0 in | Wt 181.0 lb

## 2019-07-09 DIAGNOSIS — M542 Cervicalgia: Secondary | ICD-10-CM

## 2019-07-09 DIAGNOSIS — C801 Malignant (primary) neoplasm, unspecified: Secondary | ICD-10-CM

## 2019-07-09 DIAGNOSIS — M4802 Spinal stenosis, cervical region: Secondary | ICD-10-CM

## 2019-07-09 DIAGNOSIS — I10 Essential (primary) hypertension: Secondary | ICD-10-CM

## 2019-07-09 DIAGNOSIS — R1030 Lower abdominal pain, unspecified: Secondary | ICD-10-CM

## 2019-07-09 DIAGNOSIS — D0511 Intraductal carcinoma in situ of right breast: Secondary | ICD-10-CM

## 2019-07-09 DIAGNOSIS — R9389 Abnormal findings on diagnostic imaging of other specified body structures: Secondary | ICD-10-CM

## 2019-07-09 DIAGNOSIS — E039 Hypothyroidism, unspecified: Secondary | ICD-10-CM

## 2019-07-09 DIAGNOSIS — M47812 Spondylosis without myelopathy or radiculopathy, cervical region: Secondary | ICD-10-CM

## 2019-07-09 DIAGNOSIS — Z853 Personal history of malignant neoplasm of breast: Secondary | ICD-10-CM

## 2019-07-09 DIAGNOSIS — K5792 Diverticulitis of intestine, part unspecified, without perforation or abscess without bleeding: Secondary | ICD-10-CM | POA: Diagnosis not present

## 2019-07-09 HISTORY — DX: Diverticulitis of intestine, part unspecified, without perforation or abscess without bleeding: K57.92

## 2019-07-09 MED ORDER — CIPROFLOXACIN HCL 500 MG PO TABS: 500.0000 mg | ORAL_TABLET | Freq: Two times a day (BID) | ORAL | 0 refills | Status: DC

## 2019-07-09 MED ORDER — METRONIDAZOLE 500 MG PO TABS: 500.0000 mg | ORAL_TABLET | Freq: Three times a day (TID) | ORAL | 0 refills | Status: DC

## 2019-07-09 MED ORDER — OXYCODONE-ACETAMINOPHEN 5-325 MG PO TABS: 1.0000 | ORAL_TABLET | Freq: Two times a day (BID) | ORAL | 0 refills | Status: DC | PRN

## 2019-07-09 NOTE — Progress Notes (Addendum)
Virtual Visit via Video Note  I connected with Adriana Cobb  on 07/09/19 at  2:30 PM EST by a video enabled telemedicine application and verified that I am speaking with the correct person using two identifiers.  Location patient: home Location provider:work or home office Persons participating in the virtual visit: patient, provider, pts family as she is moving today after 25 years in her home living with friends until another house found   I discussed the limitations of evaluation and management by telemedicine and the availability of in person appointments. The patient expressed understanding and agreed to proceed.   HPI: 1. HTN uncontrolled BP 152/85 prior to visit on benicar 20 mg qd and 160/100  2. C/o abdominal pain and dx'ed diverticulitis and given cipro and flagyl but didn't complete course only took 8/14 days around this time CT 06/24/2019 IMPRESSION: Giant inflamed diverticulum in the ascending colon with extensive soft tissue reaction around the diverticulum consistent with acute Diverticulitis. Pain was on the right side but now more left sided and resolved on the right today 1-2/10 prior to this her abdomen felt tight and was painful 12/10. Nothing tried for pain  Reviewed labs ALT was 36 prior to CT scan  3. H/o breast cancer consider h/o referral in the future only appears she has seen surgery  4. Chronic neck pain with abnormal MRI neck 11/21/2018 pt wants referral to Dr. Sharlet Salina  FINDINGS: Alignment: Normal alignment with straightening of the cervical lordosis.  Vertebrae: Normal bone marrow.  Negative for fracture or mass  Cord: Normal signal and morphology  Posterior Fossa, vertebral arteries, paraspinal tissues: Negative  Disc levels:  C2-3: Mild disc and mild facet degeneration.  Negative for stenosis  C3-4: Disc degeneration and mild spurring. Bilateral facet degeneration. Moderate left foraminal stenosis.  C4-5: Disc degeneration with disc space  narrowing and spurring. Left-sided facet degeneration. Mild left foraminal narrowing.  C5-6: Disc degeneration and spondylosis with diffuse uncinate spurring. Mild spinal stenosis and mild foraminal stenosis bilaterally  C6-7: Disc degeneration and spondylosis. Mild foraminal narrowing bilaterally  C7-T1: Negative  IMPRESSION: Multilevel cervical spondylosis. Spinal and foraminal stenosis as described above. No acute abnormality.    ROS: See pertinent positives and negatives per HPI.  Past Medical History:  Diagnosis Date  . BRCA negative 2016   MyRisk neg  . 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   . Cancer ([place]) 2006   pT2,N2a,Mx; ER/PR negative, Her 2 neu: 3+ ([facility]). Dermal lymphatic involvement, 9/ 22 nodes positive with extranodal extension.  Treated with Cytoxan, Adriamycin, Taxol and Herceptin.  . H/O cystitis   . Hemorrhoids   . Hot flashes   . Hx of migraines   . Hyperlipidemia   . Hypothyroid   . Osteopenia 11/2017   [facility] at [facility]  . Palpitations   . Paroxysmal supraventricular tachycardia ([place])   . Personal history of tobacco use, presenting hazards to health   . Skin cancer    nmsc Dr. Kellie Moor   . Unspecified essential hypertension   . Unspecified hypothyroidism     Past Surgical History:  Procedure Laterality Date  . BREAST BIOPSY Left 2006   stereo breast bx  . BREAST RECONSTRUCTION Bilateral 06/08/14  . [place]   x 2  . COLONOSCOPY  2007   Dr. Allen Norris  . MASTECTOMY Left 2006   left breast  . MASTECTOMY  2015   right breast  Family History  Problem Relation Age of Onset  . Cancer Other        breast cancer  . 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  Cholecalciferol (VITAMIN D PO), Take 1 tablet by mouth daily. 2000 IU  daily, Disp: , Rfl:  .  citalopram (CELEXA) 20 MG tablet, Take 1 tablet (20 mg total) by mouth daily., Disp: 90 tablet, Rfl: 3 .  [facility] CALCIUM PO, Take by mouth., Disp: , Rfl:  .  cyanocobalamin 2000 MCG tablet, Take 2,000 mcg by mouth daily., Disp: , Rfl:  .  levothyroxine (SYNTHROID, LEVOTHROID) 75 MCG tablet, TAKE ONE TABLET BY MOUTH ONCE DAILY, TAKE ON AN EMPTY STOMACH AND WAIT 30 minutes before food, Disp: 90 tablet, Rfl: 3 .  olmesartan (BENICAR) 20 MG tablet, Take 1 tablet (20 mg total) by mouth daily., Disp: 90 tablet, Rfl: 3 .  simvastatin (ZOCOR) 40 MG tablet, Take 1 tablet (40 mg total) by mouth daily at 6 PM., Disp: 90 tablet, Rfl: 3 .  ciprofloxacin (CIPRO) 500 MG tablet, Take 1 tablet (500 mg total) by mouth 2 (two) times daily. With food, Disp: 14 tablet, Rfl: 0 .  hydrochlorothiazide (HYDRODIURIL) 12.5 MG tablet, Take 1 tablet (12.5 mg total) by mouth daily. In am, Disp: 90 tablet, Rfl: 3 .  metroNIDAZOLE (FLAGYL) 500 MG tablet, Take 1 tablet (500 mg total) by mouth 3 (three) times daily. With food, Disp: 21 tablet, Rfl: 0 .  oxyCODONE-acetaminophen (PERCOCET) 5-325 MG tablet, Take 1 tablet by mouth 2 (two) times daily as needed for severe pain., Disp: 10 tablet, Rfl: 0  EXAM:  VITALS per patient if applicable:  GENERAL: alert, oriented, appears well and in no acute distress  HEENT: atraumatic, conjunttiva clear, no obvious abnormalities on inspection of external nose and ears  NECK: normal movements of the head and neck  LUNGS: on inspection no signs of respiratory distress, breathing rate appears normal, no obvious gross SOB, gasping or wheezing  CV: no obvious cyanosis  MS: moves all visible extremities without noticeable abnormality  PSYCH/NEURO: pleasant and cooperative, no obvious depression or anxiety, speech and thought processing grossly intact  ASSESSMENT AND PLAN:  Discussed the following assessment and plan:  Cervicalgia, chronic with abnormal MRI cervical  with foraminal stenosis and cervical spondylosis- Plan: Ambulatory referral to Physical Medicine Rehab Dr. Sharlet Salina   Diverticulitis with lower ab pain L>R- Plan: metroNIDAZOLE (FLAGYL) 500 MG tablet tid, ciprofloxacin (CIPRO) 500 MG tablet bid x 1 week, oxyCODONE-acetaminophen (PERCOCET) 5-325 MG tablet, Ambulatory referral to General Surgery Dr. Bary Castilla, Ambulatory referral to Gastroenterology [facility] 07/10/19 Dr. Bary Castilla appt diverticulitis colonoscopy sch 08/2019   Acquired hypothyroidism  History of breast cancer -consider h/o in future no h/o f/u with h/o   Essential hypertension benicar 20 mg qd add hctz 12.5 mg qd   Hypothyroidism controlled  Cont levo 61mg  Check TSH in end 2/21  [facility] Labs 09/2019 with [facility] [place] Flu shot utd per pt had at work 06/2019  Consider Tdap, shingrix, prevnar, pna 23 in future  MMR immune  rec hep B and neg hep C  TSH nl 08/2018   Labs 06/24/2019 [facility] 1 of liver enzymes slightly elevated ALT 36  Cholesterol normal  Thyroid labs normal  Blood cts normal  A1c 5.3 no prediabetes   CA  125 cancer marker pending need to call [facility] never sent copy of final report  CA 27.29 normal   Vitamin D 33 rec D3 2000 IU daily    Reviewed labs 06/12/18 CMET nl phos 4.6 sl high 4.5 normal, tc 164, tg 104, HDL 51, LDL 92, TSH 4.970 (elevated nl 4.50, CBC 4.3/13.6/39.2/242, CA 125 6.1, vitamin D 39.1, CA 27.29 11.3   Urine normal 09/02/2018 TSH nl 1.710  S/p b/l mastectomy for breast cancer   dexa 11/12/17 osteopenia on vitamin D3 2000 IU daily   Colonoscopy 02/11/14 normal repeat in 10 years but CT 06/24/2019 with acute diverticulitis   Pap [facility] 09/11/17 negative neg HPV   Dermatology as of 07/09/19 appt pending [facility]  Eye MD [facility]  [facility]  Surgery Dr. Bary Castilla  Dermatology Dr. Kellie Moor h/o SCC or NMSC f/u in 6 months     -we discussed possible serious and likely etiologies, options for evaluation  and workup, limitations of telemedicine visit vs in person visit, treatment, treatment risks and precautions. Pt prefers to treat via telemedicine empirically rather then risking or undertaking an in person visit at this moment. Patient agrees to seek prompt in person care if worsening, new symptoms arise, or if is not improving with treatment.   I discussed the assessment and treatment plan with the patient. The patient was provided an opportunity to ask questions and all were answered. The patient agreed with the plan and demonstrated an understanding of the instructions.   The patient was advised to call back or seek an in-person evaluation if the symptoms worsen or if the condition fails to improve as anticipated.  Time spent 25 minutes  Delorise Jackson, MD

## 2019-07-10 DIAGNOSIS — K5732 Diverticulitis of large intestine without perforation or abscess without bleeding: Secondary | ICD-10-CM | POA: Diagnosis not present

## 2019-07-11 ENCOUNTER — Encounter: Payer: Self-pay | Admitting: Internal Medicine

## 2019-07-14 ENCOUNTER — Other Ambulatory Visit: Payer: Self-pay | Admitting: Internal Medicine

## 2019-07-14 ENCOUNTER — Encounter: Payer: Self-pay | Admitting: Internal Medicine

## 2019-07-14 ENCOUNTER — Telehealth: Payer: Self-pay | Admitting: Internal Medicine

## 2019-07-14 DIAGNOSIS — I10 Essential (primary) hypertension: Secondary | ICD-10-CM

## 2019-07-14 MED ORDER — HYDROCHLOROTHIAZIDE 12.5 MG PO TABS: 12.5000 mg | ORAL_TABLET | Freq: Every day | ORAL | 3 refills | Status: DC

## 2019-07-14 NOTE — Patient Instructions (Addendum)
Consider these vaccine prevnar and 1 year later pneumococcal 23 Tdap vaccine  shingrix vaccine   If you would like prevnar call the clinic for a nurse visit  shingrix or Tdap would be at your pharmacy but you will need a prescription which I can mail if you would like or fax to your pharmacy let me know    Diverticulitis  Diverticulitis is infection or inflammation of small pouches (diverticula) in the colon that form due to a condition called diverticulosis. Diverticula can trap stool (feces) and bacteria, causing infection and inflammation. Diverticulitis may cause severe stomach pain and diarrhea. It may lead to tissue damage in the colon that causes bleeding. The diverticula may also burst (rupture) and cause infected stool to enter other areas of the abdomen. Complications of diverticulitis can include:  Bleeding.  Severe infection.  Severe pain.  Rupture (perforation) of the colon.  Blockage (obstruction) of the colon. What are the causes? This condition is caused by stool becoming trapped in the diverticula, which allows bacteria to grow in the diverticula. This leads to inflammation and infection. What increases the risk? You are more likely to develop this condition if:  You have diverticulosis. The risk for diverticulosis increases if: ? You are overweight or obese. ? You use tobacco products. ? You do not get enough exercise.  You eat a diet that does not include enough fiber. High-fiber foods include fruits, vegetables, beans, nuts, and whole grains. What are the signs or symptoms? Symptoms of this condition may include:  Pain and tenderness in the abdomen. The pain is normally located on the left side of the abdomen, but it may occur in other areas.  Fever and chills.  Bloating.  Cramping.  Nausea.  Vomiting.  Changes in bowel routines.  Blood in your stool. How is this diagnosed? This condition is diagnosed based on:  Your medical history.  A  physical exam.  Tests to make sure there is nothing else causing your condition. These tests may include: ? Blood tests. ? Urine tests. ? Imaging tests of the abdomen, including X-rays, ultrasounds, MRIs, or CT scans. How is this treated? Most cases of this condition are mild and can be treated at home. Treatment may include:  Taking over-the-counter pain medicines.  Following a clear liquid diet.  Taking antibiotic medicines by mouth.  Rest. More severe cases may need to be treated at a hospital. Treatment may include:  Not eating or drinking.  Taking prescription pain medicine.  Receiving antibiotic medicines through an IV tube.  Receiving fluids and nutrition through an IV tube.  Surgery. When your condition is under control, your health care provider may recommend that you have a colonoscopy. This is an exam to look at the entire large intestine. During the exam, a lubricated, bendable tube is inserted into the anus and then passed into the rectum, colon, and other parts of the large intestine. A colonoscopy can show how severe your diverticula are and whether something else may be causing your symptoms. Follow these instructions at home: Medicines  Take over-the-counter and prescription medicines only as told by your health care provider. These include fiber supplements, probiotics, and stool softeners.  If you were prescribed an antibiotic medicine, take it as told by your health care provider. Do not stop taking the antibiotic even if you start to feel better.  Do not drive or use heavy machinery while taking prescription pain medicine. General instructions   Follow a full liquid diet or another diet  as directed by your health care provider. After your symptoms improve, your health care provider may tell you to change your diet. He or she may recommend that you eat a diet that contains at least 25 g (25 grams) of fiber daily. Fiber makes it easier to pass stool. Healthy  sources of fiber include: ? Berries. One cup contains 4-8 grams of fiber. ? Beans or lentils. One half cup contains 5-8 grams of fiber. ? Green vegetables. One cup contains 4 grams of fiber.  Exercise for at least 30 minutes, 3 times each week. You should exercise hard enough to raise your heart rate and break a sweat.  Keep all follow-up visits as told by your health care provider. This is important. You may need a colonoscopy. Contact a health care provider if:  Your pain does not improve.  You have a hard time drinking or eating food.  Your bowel movements do not return to normal. Get help right away if:  Your pain gets worse.  Your symptoms do not get better with treatment.  Your symptoms suddenly get worse.  You have a fever.  You vomit more than one time.  You have stools that are bloody, black, or tarry. Summary  Diverticulitis is infection or inflammation of small pouches (diverticula) in the colon that form due to a condition called diverticulosis. Diverticula can trap stool (feces) and bacteria, causing infection and inflammation.  You are at higher risk for this condition if you have diverticulosis and you eat a diet that does not include enough fiber.  Most cases of this condition are mild and can be treated at home. More severe cases may need to be treated at a hospital.  When your condition is under control, your health care provider may recommend that you have an exam called a colonoscopy. This exam can show how severe your diverticula are and whether something else may be causing your symptoms. This information is not intended to replace advice given to you by your health care provider. Make sure you discuss any questions you have with your health care provider. Document Released: 05/03/2005 Document Revised: 07/06/2017 Document Reviewed: 08/26/2016 Elsevier Patient Education  2020 [employer].

## 2019-07-14 NOTE — Telephone Encounter (Signed)
Call [facility] never received copy of CA 125 from labs 06/2019 and need copy of final labs faxed to me asap   Thanks [place]

## 2019-07-14 NOTE — Telephone Encounter (Signed)
Called and they are faxing results now.

## 2019-07-21 ENCOUNTER — Other Ambulatory Visit: Payer: Self-pay | Admitting: General Surgery

## 2019-08-18 DIAGNOSIS — M5030 Other cervical disc degeneration, unspecified cervical region: Secondary | ICD-10-CM | POA: Diagnosis not present

## 2019-08-18 DIAGNOSIS — M5412 Radiculopathy, cervical region: Secondary | ICD-10-CM | POA: Diagnosis not present

## 2019-08-19 DIAGNOSIS — K5732 Diverticulitis of large intestine without perforation or abscess without bleeding: Secondary | ICD-10-CM | POA: Diagnosis not present

## 2019-08-20 ENCOUNTER — Other Ambulatory Visit: Payer: Self-pay | Admitting: Internal Medicine

## 2019-08-20 DIAGNOSIS — E039 Hypothyroidism, unspecified: Secondary | ICD-10-CM

## 2019-08-20 MED ORDER — LEVOTHYROXINE SODIUM 75 MCG PO TABS: ORAL_TABLET | ORAL | 3 refills | Status: DC

## 2019-08-22 ENCOUNTER — Telehealth: Payer: Self-pay | Admitting: Cardiovascular Disease

## 2019-08-22 ENCOUNTER — Encounter: Payer: Self-pay | Admitting: Internal Medicine

## 2019-08-25 ENCOUNTER — Other Ambulatory Visit: Payer: Self-pay | Admitting: Internal Medicine

## 2019-08-25 DIAGNOSIS — I10 Essential (primary) hypertension: Secondary | ICD-10-CM

## 2019-08-25 MED ORDER — HYDROCHLOROTHIAZIDE 25 MG PO TABS: 25.0000 mg | ORAL_TABLET | Freq: Every day | ORAL | 3 refills | Status: DC

## 2019-08-28 ENCOUNTER — Other Ambulatory Visit: Payer: Self-pay

## 2019-08-28 ENCOUNTER — Encounter: Payer: Self-pay | Admitting: Family

## 2019-08-28 ENCOUNTER — Telehealth (INDEPENDENT_AMBULATORY_CARE_PROVIDER_SITE_OTHER): Payer: BC Managed Care – PPO | Admitting: Family

## 2019-08-28 VITALS — Ht 66.0 in | Wt 180.0 lb

## 2019-08-28 DIAGNOSIS — I10 Essential (primary) hypertension: Secondary | ICD-10-CM | POA: Diagnosis not present

## 2019-08-28 DIAGNOSIS — E782 Mixed hyperlipidemia: Secondary | ICD-10-CM | POA: Diagnosis not present

## 2019-08-28 DIAGNOSIS — I471 Supraventricular tachycardia: Secondary | ICD-10-CM

## 2019-08-28 NOTE — Patient Instructions (Addendum)
Medication Instructions:  Continue Olmesartan 20 mg daily.  Would recommend starting hydrochlorothiazide as discussed with your primary care provider.  You may start with 12.5 mg or half tablet for a few days, monitor your blood pressure, and then increase to 25 mg daily.  *If you need a refill on your cardiac medications before your next appointment, please call your pharmacy*  Lab Work: None ordered today.  Lab work reviewed from November looks great!  Testing/Procedures: None ordered today  Follow-Up: At [facility], you and your health needs are our priority.  As part of our continuing mission to provide you with exceptional heart care, we have created designated Provider Care Teams.  These Care Teams include your primary Cardiologist (physician) and Advanced Practice Providers (APPs -  Physician Assistants and Nurse Practitioners) who all work together to provide you with the care you need, when you need it.  Your next appointment:   1 year(s)  The format for your next appointment:   In Person  Provider:    You may see Ida Rogue, MD or one of the following Advanced Practice Providers on your designated Care Team:    Murray Hodgkins, NP  Christell Faith, PA-C  Marrianne Mood, PA-C   Other Instructions  Here is the website to register for your COVID vaccine through [facility] if you would like. Or simply get it at work. Whichever is easiest for you! [email or web address]   Tips to Measure your Blood Pressure Correctly  To determine whether you have hypertension, a medical professional will take a blood pressure reading. How you prepare for the test, the position of your arm, and other factors can change a blood pressure reading by 10% or more. That could be enough to hide high blood pressure, start you on a drug you don't really need, or lead your doctor to incorrectly adjust your  medications.  National and international guidelines offer specific instructions for measuring blood pressure. If a doctor, nurse, or medical assistant isn't doing it right, don't hesitate to ask him or her to get with the guidelines.  Here's what you can do to ensure a correct reading: . Don't drink a caffeinated beverage or smoke during the 30 minutes before the test. . Sit quietly for five minutes before the test begins. . During the measurement, sit in a chair with your feet on the floor and your arm supported so your elbow is at about heart level. . The inflatable part of the cuff should completely cover at least 80% of your upper arm, and the cuff should be placed on bare skin, not over a shirt. . Don't talk during the measurement. . Have your blood pressure measured twice, with a brief break in between. If the readings are different by 5 points or more, have it done a third time.  There are times to break these rules. If you sometimes feel lightheaded when getting out of bed in the morning or when you stand after sitting, you should have your blood pressure checked while seated and then while standing to see if it falls from one position to the next.  Because blood pressure varies throughout the day, your doctor will rarely diagnose hypertension on the basis of a single reading. Instead, he or she will want to confirm the measurements on at least two occasions, usually within a few weeks of one another. The exception to this rule is if you have a blood pressure reading of 180/110 mm Hg or higher. A result this high  usually calls for prompt treatment.  It's a good idea to have your blood pressure measured in both arms at least once, since the reading in one arm (usually the right) may be higher than that in the left. A 2014 study in The American Journal of Medicine of nearly 3,400 people found average arm- to-arm differences in systolic blood pressure of about 5 points. The higher number should  be used to make treatment decisions.  In 2017, new guidelines from the [place], the [employer] of Cardiology, and nine other health organizations lowered the diagnosis of high blood pressure to 130/80 mm Hg or higher for all adults. The guidelines also redefined the various blood pressure categories to now include normal, elevated, Stage 1 hypertension, Stage 2 hypertension, and hypertensive crisis (see "Blood pressure categories").  Blood pressure categories  Blood pressure category SYSTOLIC (upper number)  DIASTOLIC (lower number)  Normal Less than 120 mm Hg and Less than 80 mm Hg  Elevated 120-129 mm Hg and Less than 80 mm Hg  High blood pressure: Stage 1 hypertension 130-139 mm Hg or 80-89 mm Hg  High blood pressure: Stage 2 hypertension 140 mm Hg or higher or 90 mm Hg or higher  Hypertensive crisis (consult your doctor immediately) Higher than 180 mm Hg and/or Higher than 120 mm Hg  Source: American Heart Association and American Stroke Association. For more on getting your blood pressure under control, buy Controlling Your Blood Pressure, a Special Health Report from [facility].   Blood Pressure Log   Date   Time  Blood Pressure  Position  Example: Nov 1 9 AM 124/78 sitting

## 2019-08-28 NOTE — Progress Notes (Signed)
Virtual Visit via Video Note   This visit type was conducted due to national recommendations for restrictions regarding the COVID-19 Pandemic (e.g. social distancing) in an effort to limit this patient's exposure and mitigate transmission in our community.  Due to her co-morbid illnesses, this patient is at least at moderate risk for complications without adequate follow up.  This format is felt to be most appropriate for this patient at this time.  All issues noted in this document were discussed and addressed.  A limited physical exam was performed with this format.  Please refer to the patient's chart for her consent to telehealth for [facility].   Date:  08/28/2019   ID:  Adriana Cobb, DOB 1951-04-02, MRN [id number]  Patient Location: Home Provider Location: Office  PCP:  McLean-Scocuzza, Nino Glow, MD  Cardiologist:  Ida Rogue, MD  Electrophysiologist:  None   Evaluation Performed:  Follow-Up Visit  Chief Complaint:  Elevated blood pressure  History of Present Illness:    Adriana Cobb is a 69 y.o. female with hx of breast cancer s/p mastectomy/chemotherapy/radiation in 2006, SVT with 2 episodes in 2008 treated with adenosine initialy on amiodarone changed to sotalol, HLD.  Her sotalol was slowly down titrated and then discontinued.  She has had no recurrent SVT since her last office.   Reports no chest pain, pressure, tightness.  Reports no shortness of breath no DOE.  No edema, orthopnea, PND.  No formal exercise regimen.  She does endorse trying to watch what she eats and eats a low-sodium diet.  Works and is an Web designer at [employer].  She has recently been working with her primary care provider regarding her blood pressure.  She does not check her blood pressure at home but does have a checked at work.  Encouraged her to purchase an arm cuff-she tells me she has one on the way.   The patient does not have symptoms concerning for COVID-19  infection (fever, chills, cough, or new shortness of breath).    Past Medical History:  Diagnosis Date  . BRCA negative 2016   MyRisk neg  . 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   . Cancer ([place]) 2006   pT2,N2a,Mx; ER/PR negative, Her 2 neu: 3+ ([facility]). Dermal lymphatic involvement, 9/ 22 nodes positive with extranodal extension.  Treated with Cytoxan, Adriamycin, Taxol and Herceptin.  . H/O cystitis   . Hemorrhoids   . Hot flashes   . Hx of migraines   . Hyperlipidemia   . Hypothyroid   . Osteopenia 11/2017   DEXA at [facility]  . Palpitations   . Paroxysmal supraventricular tachycardia ([place])   . Personal history of tobacco use, presenting hazards to health   . Skin cancer    nmsc Dr. Kellie Moor   . Unspecified essential hypertension   . Unspecified hypothyroidism    Past Surgical History:  Procedure Laterality Date  . BREAST BIOPSY Left 2006   stereo breast bx  . BREAST RECONSTRUCTION Bilateral 06/08/14  . [place]   x 2  . COLONOSCOPY  2007   Dr. Allen Norris  . MASTECTOMY Left 2006   left breast  . MASTECTOMY  2015   right breast      Current Meds  Medication Sig  . Cholecalciferol (VITAMIN D PO) Take 1 tablet by mouth as needed. 2000 IU daily  . citalopram (CELEXA) 20 MG tablet Take 1 tablet (20 mg  total) by mouth daily.  Marland Kitchen levothyroxine (SYNTHROID) 75 MCG tablet TAKE ONE TABLET BY MOUTH ONCE DAILY, TAKE ON AN EMPTY STOMACH AND WAIT 30 minutes before food  . olmesartan (BENICAR) 20 MG tablet Take 1 tablet (20 mg total) by mouth daily.  . simvastatin (ZOCOR) 40 MG tablet Take 1 tablet (40 mg total) by mouth daily at 6 PM.     Allergies:   Enalapril and Other   Social History   Tobacco Use  . Smoking status: Former Smoker    Packs/day: 1.00    Years: 10.00    Pack years: 10.00    Types: Cigarettes    Quit date: 09/18/1978    Years since quitting: 40.9  . Smokeless tobacco: Never Used  Substance  Use Topics  . Alcohol use: Yes    Comment: bottle a wine weekly  . Drug use: No     Family Hx: The patient's family history includes Cancer in an other family member; Hypertension in her mother.  ROS:   Please see the history of present illness.    Review of Systems  Constitution: Negative for chills, fever and malaise/fatigue.  Cardiovascular: Negative for chest pain, dyspnea on exertion, leg swelling, near-syncope, orthopnea, palpitations and syncope.  Respiratory: Negative for cough, shortness of breath and wheezing.   Gastrointestinal: Negative for nausea and vomiting.  Neurological: Negative for dizziness, light-headedness and weakness.   All other systems reviewed and are negative.  Labs/Other Tests and Data Reviewed:    EKG:  No ECG reviewed.  Recent Labs: No results found for requested labs within last 8760 hours.   Recent Lipid Panel No results found for: CHOL, TRIG, HDL, CHOLHDL, LDLCALC, LDLDIRECT  Wt Readings from Last 3 Encounters:  08/28/19 180 lb (81.6 kg)  07/09/19 181 lb (82.1 kg)  07/01/19 181 lb 12.8 oz (82.5 kg)     Objective:    Vital Signs:  Ht '5\' 6"'  (1.676 m)   Wt 180 lb (81.6 kg)   BMI 29.05 kg/m    VITAL SIGNS:  reviewed GEN:  no acute distress RESPIRATORY:  normal respiratory effort, symmetric expansion CARDIOVASCULAR:  no peripheral edema NEURO:  alert and oriented x 3, no obvious focal deficit PSYCH:  normal affect  ASSESSMENT & PLAN:    1. HLD -recent LDL goal of less than 100.  Continue simvastatin 40 mg daily as prescribed by her PCP.  2. HTN, BP goal <130/80 - Working with her PCP regarding elevated blood pressures.  On Olmesartan 20 mg daily. She had some confusion regarding her HCTZ and was unclear how she should be taking. Reassured her that many people require more than one medication for adequate management of blood pressure and that the medications her primary care provider has started are both good medications.  She will add  hydrochlorothiazide 12.5 mg daily for a few days, monitor her blood pressure, and then increase to the 25 mg daily.  She will follow-up with her primary care provider.  3. Paroxysmal SVT -no recurrence.  She has been weaned off sotalol with no recurrent SVT.  4. Hypothyroidism -follows with her PCP.  COVID-19 Education: The signs and symptoms of COVID-19 were discussed with the patient and how to seek care for testing (follow up with PCP or arrange E-visit).  The importance of social distancing was discussed today.  Time:   Today, I have spent 20 minutes with the patient with telehealth technology discussing the above problems.     Medication Adjustments/Labs and Tests  Ordered: Current medicines are reviewed at length with the patient today.  Concerns regarding medicines are outlined above.   Tests Ordered: No orders of the defined types were placed in this encounter.   Medication Changes: No orders of the defined types were placed in this encounter.   Follow Up:  In Person in 1 year(s) with Dr. Rockey Situ or APP  Signed, Loel Dubonnet, NP  08/28/2019 9:44 PM    [place]

## 2019-08-29 ENCOUNTER — Telehealth: Payer: Self-pay | Admitting: Physician Assistant

## 2019-08-29 NOTE — Telephone Encounter (Signed)
L MOM to call back to update insurance information. Patient had a virtual appointment yesterday.

## 2019-09-01 ENCOUNTER — Other Ambulatory Visit: Payer: BC Managed Care – PPO

## 2019-09-03 ENCOUNTER — Ambulatory Visit: Admit: 2019-09-03 | Payer: BC Managed Care – PPO | Admitting: General Surgery

## 2019-09-03 SURGERY — COLONOSCOPY WITH PROPOFOL
Anesthesia: General

## 2019-09-12 ENCOUNTER — Ambulatory Visit: Payer: BC Managed Care – PPO | Attending: Internal Medicine

## 2019-09-12 DIAGNOSIS — Z23 Encounter for immunization: Secondary | ICD-10-CM

## 2019-09-12 NOTE — Progress Notes (Signed)
   Covid-19 Vaccination Clinic  Name:  Adriana Cobb    MRN: [id number] DOB: 1950-12-02  09/12/2019  [name] was observed post Covid-19 immunization for 15 minutes without incidence. She was provided with Vaccine Information Sheet and instruction to access the V-Safe system.   [name] was instructed to call 911 with any severe reactions post vaccine: Marland Kitchen Difficulty breathing  . Swelling of your face and throat  . A fast heartbeat  . A bad rash all over your body  . Dizziness and weakness    Immunizations Administered    Name Date Dose VIS Date Route   Moderna COVID-19 Vaccine 09/12/2019  2:32 PM 0.5 mL 07/08/2019 Intramuscular   Manufacturer: Moderna   Lot: [id number]   [id number]

## 2019-09-24 DIAGNOSIS — M5412 Radiculopathy, cervical region: Secondary | ICD-10-CM | POA: Diagnosis not present

## 2019-09-24 DIAGNOSIS — M5030 Other cervical disc degeneration, unspecified cervical region: Secondary | ICD-10-CM | POA: Diagnosis not present

## 2019-09-30 ENCOUNTER — Other Ambulatory Visit: Payer: Self-pay | Admitting: Internal Medicine

## 2019-09-30 DIAGNOSIS — F419 Anxiety disorder, unspecified: Secondary | ICD-10-CM

## 2019-09-30 DIAGNOSIS — E782 Mixed hyperlipidemia: Secondary | ICD-10-CM

## 2019-09-30 DIAGNOSIS — I10 Essential (primary) hypertension: Secondary | ICD-10-CM

## 2019-09-30 MED ORDER — CITALOPRAM HYDROBROMIDE 20 MG PO TABS: 20.0000 mg | ORAL_TABLET | Freq: Every day | ORAL | 3 refills | Status: DC

## 2019-09-30 MED ORDER — SIMVASTATIN 40 MG PO TABS: 40.0000 mg | ORAL_TABLET | Freq: Every day | ORAL | 3 refills | Status: DC

## 2019-09-30 MED ORDER — HYDROCHLOROTHIAZIDE 25 MG PO TABS: 12.5000 mg | ORAL_TABLET | Freq: Every day | ORAL | 3 refills | Status: DC

## 2019-09-30 MED ORDER — OLMESARTAN MEDOXOMIL 20 MG PO TABS: 20.0000 mg | ORAL_TABLET | Freq: Every day | ORAL | 3 refills | Status: DC

## 2019-10-07 ENCOUNTER — Other Ambulatory Visit: Payer: Self-pay | Admitting: General Surgery

## 2019-10-14 ENCOUNTER — Ambulatory Visit: Payer: BC Managed Care – PPO | Attending: Internal Medicine

## 2019-10-14 DIAGNOSIS — Z23 Encounter for immunization: Secondary | ICD-10-CM | POA: Insufficient documentation

## 2019-10-14 NOTE — Progress Notes (Signed)
   Covid-19 Vaccination Clinic  Name:  Adriana Cobb    MRN: [id number] DOB: 1951-07-25  10/14/2019  [name] was observed post Covid-19 immunization for 15 minutes without incident. She was provided with Vaccine Information Sheet and instruction to access the V-Safe system.   [name] was instructed to call 911 with any severe reactions post vaccine: Marland Kitchen Difficulty breathing  . Swelling of face and throat  . A fast heartbeat  . A bad rash all over body  . Dizziness and weakness   Immunizations Administered    Name Date Dose VIS Date Route   Moderna COVID-19 Vaccine 10/14/2019  1:59 PM 0.5 mL 07/08/2019 Intramuscular   Manufacturer: Moderna   Lot: [id number]   [place]VO:[id number]

## 2019-10-17 ENCOUNTER — Ambulatory Visit: Payer: BC Managed Care – PPO | Admitting: Internal Medicine

## 2019-10-20 ENCOUNTER — Other Ambulatory Visit
Admission: RE | Admit: 2019-10-20 | Discharge: 2019-10-20 | Disposition: A | Discharge status: Home / Self Care | Payer: BC Managed Care – PPO | Source: Ambulatory Visit | Attending: General Surgery | Admitting: General Surgery

## 2019-10-20 DIAGNOSIS — Z20822 Contact with and (suspected) exposure to covid-19: Secondary | ICD-10-CM | POA: Diagnosis not present

## 2019-10-20 DIAGNOSIS — Z01812 Encounter for preprocedural laboratory examination: Secondary | ICD-10-CM | POA: Insufficient documentation

## 2019-10-20 LAB — SARS CORONAVIRUS 2 (TAT 6-24 HRS): SARS Coronavirus 2: NEGATIVE

## 2019-10-22 ENCOUNTER — Ambulatory Visit: Payer: BC Managed Care – PPO | Admitting: Certified Registered"

## 2019-10-22 ENCOUNTER — Encounter
Admission: RE | Disposition: A | Discharge status: Home / Self Care | Payer: Self-pay | Source: Home / Self Care | Attending: General Surgery

## 2019-10-22 ENCOUNTER — Ambulatory Visit
Admission: RE | Admit: 2019-10-22 | Discharge: 2019-10-22 | Disposition: A | Discharge status: Home / Self Care | Payer: BC Managed Care – PPO | Attending: General Surgery | Admitting: General Surgery

## 2019-10-22 ENCOUNTER — Other Ambulatory Visit: Payer: Self-pay

## 2019-10-22 DIAGNOSIS — I10 Essential (primary) hypertension: Secondary | ICD-10-CM | POA: Diagnosis not present

## 2019-10-22 DIAGNOSIS — Z79890 Hormone replacement therapy (postmenopausal): Secondary | ICD-10-CM | POA: Diagnosis not present

## 2019-10-22 DIAGNOSIS — E039 Hypothyroidism, unspecified: Secondary | ICD-10-CM | POA: Insufficient documentation

## 2019-10-22 DIAGNOSIS — M8580 Other specified disorders of bone density and structure, unspecified site: Secondary | ICD-10-CM | POA: Diagnosis not present

## 2019-10-22 DIAGNOSIS — R933 Abnormal findings on diagnostic imaging of other parts of digestive tract: Secondary | ICD-10-CM | POA: Diagnosis not present

## 2019-10-22 DIAGNOSIS — E785 Hyperlipidemia, unspecified: Secondary | ICD-10-CM | POA: Insufficient documentation

## 2019-10-22 DIAGNOSIS — I471 Supraventricular tachycardia: Secondary | ICD-10-CM | POA: Insufficient documentation

## 2019-10-22 DIAGNOSIS — Q438 Other specified congenital malformations of intestine: Secondary | ICD-10-CM | POA: Insufficient documentation

## 2019-10-22 DIAGNOSIS — K5790 Diverticulosis of intestine, part unspecified, without perforation or abscess without bleeding: Secondary | ICD-10-CM | POA: Diagnosis not present

## 2019-10-22 DIAGNOSIS — Z87891 Personal history of nicotine dependence: Secondary | ICD-10-CM | POA: Insufficient documentation

## 2019-10-22 DIAGNOSIS — Z888 Allergy status to other drugs, medicaments and biological substances status: Secondary | ICD-10-CM | POA: Diagnosis not present

## 2019-10-22 DIAGNOSIS — K5792 Diverticulitis of intestine, part unspecified, without perforation or abscess without bleeding: Secondary | ICD-10-CM | POA: Diagnosis not present

## 2019-10-22 DIAGNOSIS — Z79899 Other long term (current) drug therapy: Secondary | ICD-10-CM | POA: Insufficient documentation

## 2019-10-22 DIAGNOSIS — K5730 Diverticulosis of large intestine without perforation or abscess without bleeding: Secondary | ICD-10-CM | POA: Diagnosis not present

## 2019-10-22 DIAGNOSIS — Z85828 Personal history of other malignant neoplasm of skin: Secondary | ICD-10-CM | POA: Diagnosis not present

## 2019-10-22 DIAGNOSIS — Z853 Personal history of malignant neoplasm of breast: Secondary | ICD-10-CM | POA: Diagnosis not present

## 2019-10-22 HISTORY — PX: COLONOSCOPY WITH PROPOFOL: SHX5780

## 2019-10-22 SURGERY — COLONOSCOPY WITH PROPOFOL
Anesthesia: General

## 2019-10-22 MED ORDER — PROPOFOL 10 MG/ML IV BOLUS
INTRAVENOUS | Status: AC
  Filled 2019-10-22: qty 20

## 2019-10-22 MED ORDER — SODIUM CHLORIDE 0.9 % IV SOLN
INTRAVENOUS | Status: DC
  Administered 2019-10-22: 1000 mL via INTRAVENOUS

## 2019-10-22 MED ORDER — PROPOFOL 500 MG/50ML IV EMUL
INTRAVENOUS | Status: DC | PRN
  Administered 2019-10-22: 150 ug/kg/min via INTRAVENOUS

## 2019-10-22 MED ORDER — PROPOFOL 10 MG/ML IV BOLUS
INTRAVENOUS | Status: DC | PRN
  Administered 2019-10-22: 50 mg via INTRAVENOUS

## 2019-10-22 MED ORDER — PHENYLEPHRINE HCL (PRESSORS) 10 MG/ML IV SOLN
INTRAVENOUS | Status: DC | PRN
  Administered 2019-10-22: 100 ug via INTRAVENOUS

## 2019-10-22 MED ORDER — PROPOFOL 10 MG/ML IV BOLUS
INTRAVENOUS | Status: AC
  Filled 2019-10-22: qty 40

## 2019-10-22 NOTE — H&P (Signed)
Adriana Cobb [id number] [birth year]     HPI:   69 year old woman with an episode of right sided diverticulitis this past fall. Normal colonoscopy in 2015.  Endoscopic exam planned to assess for secondary pathology in the ascending colon. Tolerated prep well.   Medications Prior to Admission  Medication Sig Dispense Refill Last Dose  . Cholecalciferol (VITAMIN D PO) Take 1 tablet by mouth as needed. 2000 IU daily   Past Week at Unknown time  . citalopram (CELEXA) 20 MG tablet Take 1 tablet (20 mg total) by mouth daily. 90 tablet 3 Past Week at Unknown time  . [facility] CALCIUM PO Take by mouth.   Past Week at Unknown time  . hydrochlorothiazide (HYDRODIURIL) 25 MG tablet Take 0.5-1 tablets (12.5-25 mg total) by mouth daily. In am 90 tablet 3 10/22/2019 at 0715  . levothyroxine (SYNTHROID) 75 MCG tablet TAKE ONE TABLET BY MOUTH ONCE DAILY, TAKE ON AN EMPTY STOMACH AND WAIT 30 minutes before food 90 tablet 3 10/22/2019 at 0715  . olmesartan (BENICAR) 20 MG tablet Take 1 tablet (20 mg total) by mouth daily. 90 tablet 3 10/22/2019 at 0715  . simvastatin (ZOCOR) 40 MG tablet Take 1 tablet (40 mg total) by mouth daily at 6 PM. 90 tablet 3 Past Week at Unknown time  . cyanocobalamin 2000 MCG tablet Take 2,000 mcg by mouth daily.      Allergies  Allergen Reactions  . Enalapril     Cough   . Other Other (See Comments)    Medications ending in 'pril'; such as ace inhibitors   Past Medical History:  Diagnosis Date  . BRCA negative 2016   MyRisk neg  . 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   . Cancer ([place]) 2006   pT2,N2a,Mx; ER/PR negative, Her 2 neu: 3+ ([facility]). Dermal lymphatic involvement, 9/ 22 nodes positive with extranodal extension.  Treated with Cytoxan, Adriamycin, Taxol and Herceptin.  . H/O cystitis   . Hemorrhoids   . Hot flashes   . Hx of migraines   . Hyperlipidemia   . Hypothyroid   . Osteopenia 11/2017   DEXA at [facility]  .  Palpitations   . Paroxysmal supraventricular tachycardia ([place])   . Personal history of tobacco use, presenting hazards to health   . Skin cancer    nmsc Dr. Kellie Moor   . Unspecified essential hypertension   . Unspecified hypothyroidism    Past Surgical History:  Procedure Laterality Date  . BREAST BIOPSY Left 2006   stereo breast bx  . BREAST RECONSTRUCTION Bilateral 06/08/14  . [place]   x 2  . COLONOSCOPY  2007   Dr. Allen Norris  . MASTECTOMY Left 2006   left breast  . MASTECTOMY  2015   right breast    Social History   Socioeconomic History  . Marital status: Married    Spouse name: Not on file  . Number of children: Not on file  . Years of education: Not on file  . Highest education level: Not on file  Occupational History  . Not on file  Tobacco Use  . Smoking status: Former Smoker    Packs/day: 1.00    Years: 10.00    Pack years: 10.00    Types: Cigarettes    Quit date: 09/18/1978    Years since quitting: 41.1  . Smokeless tobacco: Never Used  Substance and Sexual Activity  . Alcohol use:  Yes    Comment: bottle a wine weekly  . Drug use: No  . Sexual activity: Not on file  Other Topics Concern  . 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    Married    No guns, wears seat belt, safe in relationship    2 kids    Former smoker    Social Determinants of Radio broadcast assistant Strain:   . Difficulty of Paying Living Expenses:   Food Insecurity:   . Worried About Charity fundraiser in the Last Year:   . Arboriculturist in the Last Year:   Transportation Needs:   . Film/video editor (Medical):   Marland Kitchen Lack of Transportation (Non-Medical):   Physical Activity:   . Days of Exercise per Week:   . Minutes of Exercise per Session:   Stress:   . Feeling of Stress :   Social Connections:   . Frequency of Communication with Friends and Family:   . Frequency of Social Gatherings with Friends and Family:   .  Attends Religious Services:   . Active Member of Clubs or Organizations:   . Attends Archivist Meetings:   Marland Kitchen Marital Status:   Intimate Partner Violence:   . Fear of Current or Ex-Partner:   . Emotionally Abused:   Marland Kitchen Physically Abused:   . 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    Married    No guns, wears seat belt, safe in relationship    2 kids    Former smoker      ROS: Negative.     PE: HEENT: Negative. Lungs: Clear. Cardio: RR.   Assessment/Plan:  Proceed with planned endoscopy.   [facility] 10/22/2019

## 2019-10-22 NOTE — Op Note (Signed)
[facility] Gastroenterology Patient Name: Adriana Cobb Procedure Date: 10/22/2019 8:59 AM MRN: [id number] Account #: [id number] Date of Birth: [birth year] Admit Type: Outpatient Age: 69 Room: [facility] ENDO ROOM 1 Gender: Female Note Status: Finalized Procedure:             Colonoscopy Indications:           Abnormal CT of the GI tract, right side diverticulitis Providers:             Robert Bellow, MD Medicines:             Monitored Anesthesia Care Complications:         No immediate complications. Procedure:             Pre-Anesthesia Assessment:                        - Prior to the procedure, a History and Physical was                         performed, and patient medications, allergies and                         sensitivities were reviewed. The patient's tolerance                         of previous anesthesia was reviewed.                        - The risks and benefits of the procedure and the                         sedation options and risks were discussed with the                         patient. All questions were answered and informed                         consent was obtained.                        After obtaining informed consent, the colonoscope was                         passed under direct vision. Throughout the procedure,                         the patient's blood pressure, pulse, and oxygen                         saturations were monitored continuously. The                         Colonoscope was introduced through the anus and                         advanced to the the cecum, identified by appendiceal                         orifice and ileocecal valve. The colonoscopy was  somewhat difficult due to a tortuous colon. Successful                         completion of the procedure was aided by using manual                         pressure. The patient tolerated the procedure well.                         The  quality of the bowel preparation was excellent. Findings:      A few small-mouthed diverticula were found in the sigmoid colon and       cecum.      The retroflexed view of the distal rectum and anal verge was normal and       showed no anal or rectal abnormalities. Impression:            - Diverticulosis in the sigmoid colon and in the cecum.                        - The distal rectum and anal verge are normal on                         retroflexion view.                        - No specimens collected. Recommendation:        - Repeat colonoscopy in 10 years for screening                         purposes. Procedure Code(s):     --- Professional ---                        [phone], Colonoscopy, flexible; diagnostic, including                         collection of specimen(s) by brushing or washing, when                         performed (separate procedure) Diagnosis Code(s):     --- Professional ---                        K57.30, Diverticulosis of large intestine without                         perforation or abscess without bleeding                        R93.3, Abnormal findings on diagnostic imaging of                         other parts of digestive tract CPT copyright 2019 American Medical Association. All rights reserved. The codes documented in this report are preliminary and upon coder review may  be revised to meet current compliance requirements. Robert Bellow, MD 10/22/2019 9:44:17 AM This report has been signed electronically. Number of Addenda: 0 Note Initiated On: 10/22/2019 8:59 AM Scope Withdrawal Time: 0 hours 16 minutes 28 seconds  Total Procedure Duration: 0 hours 30 minutes  8 seconds       [facility]

## 2019-10-22 NOTE — Anesthesia Postprocedure Evaluation (Signed)
Anesthesia Post Note  Patient: Adriana Cobb  Procedure(s) Performed: COLONOSCOPY WITH PROPOFOL (N/A )  Patient location during evaluation: Endoscopy Anesthesia Type: General Level of consciousness: awake and alert Pain management: pain level controlled Vital Signs Assessment: post-procedure vital signs reviewed and stable Respiratory status: spontaneous breathing, nonlabored ventilation, respiratory function stable and patient connected to nasal cannula oxygen Cardiovascular status: blood pressure returned to baseline and stable Postop Assessment: no apparent nausea or vomiting Anesthetic complications: no     Last Vitals:  Vitals:   10/22/19 0945 10/22/19 0955  BP: (!) 85/74 97/65  Pulse: 72 75  Resp: (!) 21 15  Temp: 36.4 C   SpO2: 95% 100%    Last Pain:  Vitals:   10/22/19 1015  TempSrc:   PainSc: 0-No pain                 Martha Clan

## 2019-10-22 NOTE — Anesthesia Preprocedure Evaluation (Addendum)
Anesthesia Evaluation  Patient identified by MRN, date of birth, ID band Patient awake    Reviewed: Allergy & Precautions, H&P , NPO status , Patient's Chart, lab work & pertinent test results, reviewed documented beta blocker date and time   History of Anesthesia Complications Negative for: history of anesthetic complications  Airway Mallampati: I  TM Distance: >3 FB Neck ROM: full    Dental  (+) Teeth Intact, Caps, Dental Advidsory Given   Pulmonary neg pulmonary ROS, former smoker,    Pulmonary exam normal breath sounds clear to auscultation       Cardiovascular Exercise Tolerance: Good hypertension, (-) angina(-) Past MI and (-) Cardiac Stents Normal cardiovascular exam(-) dysrhythmias (-) Valvular Problems/Murmurs Rhythm:regular Rate:Normal     Neuro/Psych PSYCHIATRIC DISORDERS Anxiety negative neurological ROS     GI/Hepatic negative GI ROS, Neg liver ROS,   Endo/Other  neg diabetesHypothyroidism   Renal/GU negative Renal ROS  negative genitourinary   Musculoskeletal   Abdominal   Peds  Hematology negative hematology ROS (+)   Anesthesia Other Findings Past Medical History: 2016: BRCA negative     Comment:  MyRisk neg 08/11/2017: Breast neoplasm, Tis (DCIS), right     Comment:  Intermediate grade DCIS; ER: 90%; PR: 30 %.  2006: Cancer ([place])     Comment:  pT2,N2a,Mx; ER/PR negative, Her 2 neu: 3+ ([facility]). Dermal               lymphatic involvement, 9/ 22 nodes positive with               extranodal extension.  Treated with Cytoxan, Adriamycin,               Taxol and Herceptin. No date: H/O cystitis No date: Hemorrhoids No date: Hot flashes No date: Hx of migraines No date: Hyperlipidemia No date: Hypothyroid 11/2017: Osteopenia     Comment:  DEXA at [facility] No date: Palpitations No date: Paroxysmal supraventricular tachycardia (HCC) No date: Personal history of tobacco use, presenting hazards to  health No date: Skin cancer     Comment:  nmsc Dr. Kellie Moor  No date: Unspecified essential hypertension No date: Unspecified hypothyroidism   Reproductive/Obstetrics negative OB ROS                             Anesthesia Physical Anesthesia Plan  ASA: II  Anesthesia Plan: General   Post-op Pain Management:    Induction:   PONV Risk Score and Plan:   Airway Management Planned:   Additional Equipment:   Intra-op Plan:   Post-operative Plan:   Informed Consent: I have reviewed the patients History and Physical, chart, labs and discussed the procedure including the risks, benefits and alternatives for the proposed anesthesia with the patient or authorized representative who has indicated his/her understanding and acceptance.     Dental Advisory Given  Plan Discussed with: Anesthesiologist, CRNA and Surgeon  Anesthesia Plan Comments:        Anesthesia Quick Evaluation

## 2019-10-22 NOTE — Transfer of Care (Signed)
Immediate Anesthesia Transfer of Care Note  Patient: Adriana Cobb  Procedure(s) Performed: COLONOSCOPY WITH PROPOFOL (N/A )  Patient Location: Endoscopy Unit  Anesthesia Type:General  Level of Consciousness: awake, alert  and oriented  Airway & Oxygen Therapy: Patient Spontanous Breathing and Patient connected to nasal cannula oxygen  Post-op Assessment: Report given to RN and Post -op Vital signs reviewed and stable  Post vital signs: Reviewed and stable  Last Vitals:  Vitals Value Taken Time  BP    Temp    Pulse    Resp    SpO2      Last Pain:  Vitals:   10/22/19 0830  TempSrc: Temporal  PainSc: 0-No pain         Complications: No apparent anesthesia complications

## 2019-10-23 ENCOUNTER — Encounter: Payer: Self-pay | Admitting: *Deleted

## 2020-01-01 ENCOUNTER — Other Ambulatory Visit: Payer: Self-pay | Admitting: Internal Medicine

## 2020-01-01 DIAGNOSIS — I10 Essential (primary) hypertension: Secondary | ICD-10-CM

## 2020-01-02 ENCOUNTER — Telehealth: Payer: Self-pay | Admitting: Internal Medicine

## 2020-01-02 NOTE — Telephone Encounter (Signed)
lvm to reschedule 3 month follow up

## 2020-01-08 ENCOUNTER — Encounter: Payer: Self-pay | Admitting: Internal Medicine

## 2020-01-09 ENCOUNTER — Other Ambulatory Visit: Payer: Self-pay

## 2020-01-09 ENCOUNTER — Other Ambulatory Visit: Payer: Self-pay | Admitting: Internal Medicine

## 2020-01-09 DIAGNOSIS — I10 Essential (primary) hypertension: Secondary | ICD-10-CM

## 2020-01-09 MED ORDER — OLMESARTAN MEDOXOMIL 20 MG PO TABS: 20.0000 mg | ORAL_TABLET | Freq: Every day | ORAL | 0 refills | Status: DC

## 2020-01-09 MED ORDER — OLMESARTAN MEDOXOMIL 20 MG PO TABS: 20.0000 mg | ORAL_TABLET | Freq: Every day | ORAL | 1 refills | Status: DC

## 2020-01-13 ENCOUNTER — Encounter: Payer: Self-pay | Admitting: Internal Medicine

## 2020-01-13 ENCOUNTER — Other Ambulatory Visit: Payer: Self-pay

## 2020-01-13 ENCOUNTER — Ambulatory Visit: Payer: BC Managed Care – PPO | Admitting: Internal Medicine

## 2020-01-13 VITALS — BP 110/58 | HR 80 | Temp 97.3°F | Ht 65.98 in | Wt 181.4 lb

## 2020-01-13 DIAGNOSIS — C50911 Malignant neoplasm of unspecified site of right female breast: Secondary | ICD-10-CM

## 2020-01-13 DIAGNOSIS — C801 Malignant (primary) neoplasm, unspecified: Secondary | ICD-10-CM

## 2020-01-13 DIAGNOSIS — Z23 Encounter for immunization: Secondary | ICD-10-CM

## 2020-01-13 DIAGNOSIS — K7689 Other specified diseases of liver: Secondary | ICD-10-CM

## 2020-01-13 DIAGNOSIS — E039 Hypothyroidism, unspecified: Secondary | ICD-10-CM

## 2020-01-13 DIAGNOSIS — I10 Essential (primary) hypertension: Secondary | ICD-10-CM

## 2020-01-13 DIAGNOSIS — C50912 Malignant neoplasm of unspecified site of left female breast: Secondary | ICD-10-CM

## 2020-01-13 DIAGNOSIS — E785 Hyperlipidemia, unspecified: Secondary | ICD-10-CM

## 2020-01-13 MED ORDER — TETANUS-DIPHTH-ACELL PERTUSSIS 5-2.5-18.5 LF-MCG/0.5 IM SUSP: 0.5000 mL | Freq: Once | INTRAMUSCULAR | 0 refills | Status: AC

## 2020-01-13 MED ORDER — SHINGRIX 50 MCG/0.5ML IM SUSR: 0.5000 mL | Freq: Once | INTRAMUSCULAR | 0 refills | Status: AC

## 2020-01-13 MED ORDER — HYDROCHLOROTHIAZIDE 25 MG PO TABS: 25.0000 mg | ORAL_TABLET | Freq: Every day | ORAL | 3 refills | Status: DC

## 2020-01-13 MED ORDER — OLMESARTAN MEDOXOMIL 20 MG PO TABS: 20.0000 mg | ORAL_TABLET | Freq: Every day | ORAL | 3 refills | Status: DC

## 2020-01-13 NOTE — Patient Instructions (Addendum)
Vitamin D3 2000 IU daily over the counter   Dr. Janese Banks oncology just a check in    Shoulder Exercises Ask your health care provider which exercises are safe for you. Do exercises exactly as told by your health care provider and adjust them as directed. It is normal to feel mild stretching, pulling, tightness, or discomfort as you do these exercises. Stop right away if you feel sudden pain or your pain gets worse. Do not begin these exercises until told by your health care provider. Stretching exercises External rotation and abduction This exercise is sometimes called corner stretch. This exercise rotates your arm outward (external rotation) and moves your arm out from your body (abduction). 1. Stand in a doorway with one of your feet slightly in front of the other. This is called a staggered stance. If you cannot reach your forearms to the door frame, stand facing a corner of a room. 2. Choose one of the following positions as told by your health care provider: ? Place your hands and forearms on the door frame above your head. ? Place your hands and forearms on the door frame at the height of your head. ? Place your hands on the door frame at the height of your elbows. 3. Slowly move your weight onto your front foot until you feel a stretch across your chest and in the front of your shoulders. Keep your head and chest upright and keep your abdominal muscles tight. 4. Hold for __________ seconds. 5. To release the stretch, shift your weight to your back foot. Repeat __________ times. Complete this exercise __________ times a day. Extension, standing 1. Stand and hold a broomstick, a cane, or a similar object behind your back. ? Your hands should be a little wider than shoulder width apart. ? Your palms should face away from your back. 2. Keeping your elbows straight and your shoulder muscles relaxed, move the stick away from your body until you feel a stretch in your shoulders  (extension). ? Avoid shrugging your shoulders while you move the stick. Keep your shoulder blades tucked down toward the middle of your back. 3. Hold for __________ seconds. 4. Slowly return to the starting position. Repeat __________ times. Complete this exercise __________ times a day. Range-of-motion exercises Pendulum  1. Stand near a wall or a surface that you can hold onto for balance. 2. Bend at the waist and let your left / right arm hang straight down. Use your other arm to support you. Keep your back straight and do not lock your knees. 3. Relax your left / right arm and shoulder muscles, and move your hips and your trunk so your left / right arm swings freely. Your arm should swing because of the motion of your body, not because you are using your arm or shoulder muscles. 4. Keep moving your hips and trunk so your arm swings in the following directions, as told by your health care provider: ? Side to side. ? Forward and backward. ? In clockwise and counterclockwise circles. 5. Continue each motion for __________ seconds, or for as long as told by your health care provider. 6. Slowly return to the starting position. Repeat __________ times. Complete this exercise __________ times a day. Shoulder flexion, standing  1. Stand and hold a broomstick, a cane, or a similar object. Place your hands a little more than shoulder width apart on the object. Your left / right hand should be palm up, and your other hand should be palm  down. 2. Keep your elbow straight and your shoulder muscles relaxed. Push the stick up with your healthy arm to raise your left / right arm in front of your body, and then over your head until you feel a stretch in your shoulder (flexion). ? Avoid shrugging your shoulder while you raise your arm. Keep your shoulder blade tucked down toward the middle of your back. 3. Hold for __________ seconds. 4. Slowly return to the starting position. Repeat __________ times.  Complete this exercise __________ times a day. Shoulder abduction, standing 1. Stand and hold a broomstick, a cane, or a similar object. Place your hands a little more than shoulder width apart on the object. Your left / right hand should be palm up, and your other hand should be palm down. 2. Keep your elbow straight and your shoulder muscles relaxed. Push the object across your body toward your left / right side. Raise your left / right arm to the side of your body (abduction) until you feel a stretch in your shoulder. ? Do not raise your arm above shoulder height unless your health care provider tells you to do that. ? If directed, raise your arm over your head. ? Avoid shrugging your shoulder while you raise your arm. Keep your shoulder blade tucked down toward the middle of your back. 3. Hold for __________ seconds. 4. Slowly return to the starting position. Repeat __________ times. Complete this exercise __________ times a day. Internal rotation  1. Place your left / right hand behind your back, palm up. 2. Use your other hand to dangle an exercise band, a towel, or a similar object over your shoulder. Grasp the band with your left / right hand so you are holding on to both ends. 3. Gently pull up on the band until you feel a stretch in the front of your left / right shoulder. The movement of your arm toward the center of your body is called internal rotation. ? Avoid shrugging your shoulder while you raise your arm. Keep your shoulder blade tucked down toward the middle of your back. 4. Hold for __________ seconds. 5. Release the stretch by letting go of the band and lowering your hands. Repeat __________ times. Complete this exercise __________ times a day. Strengthening exercises External rotation  1. Sit in a stable chair without armrests. 2. Secure an exercise band to a stable object at elbow height on your left / right side. 3. Place a soft object, such as a folded towel or a  small pillow, between your left / right upper arm and your body to move your elbow about 4 inches (10 cm) away from your side. 4. Hold the end of the exercise band so it is tight and there is no slack. 5. Keeping your elbow pressed against the soft object, slowly move your forearm out, away from your abdomen (external rotation). Keep your body steady so only your forearm moves. 6. Hold for __________ seconds. 7. Slowly return to the starting position. Repeat __________ times. Complete this exercise __________ times a day. Shoulder abduction  1. Sit in a stable chair without armrests, or stand up. 2. Hold a __________ weight in your left / right hand, or hold an exercise band with both hands. 3. Start with your arms straight down and your left / right palm facing in, toward your body. 4. Slowly lift your left / right hand out to your side (abduction). Do not lift your hand above shoulder height unless your health  care provider tells you that this is safe. ? Keep your arms straight. ? Avoid shrugging your shoulder while you do this movement. Keep your shoulder blade tucked down toward the middle of your back. 5. Hold for __________ seconds. 6. Slowly lower your arm, and return to the starting position. Repeat __________ times. Complete this exercise __________ times a day. Shoulder extension 1. Sit in a stable chair without armrests, or stand up. 2. Secure an exercise band to a stable object in front of you so it is at shoulder height. 3. Hold one end of the exercise band in each hand. Your palms should face each other. 4. Straighten your elbows and lift your hands up to shoulder height. 5. Step back, away from the secured end of the exercise band, until the band is tight and there is no slack. 6. Squeeze your shoulder blades together as you pull your hands down to the sides of your thighs (extension). Stop when your hands are straight down by your sides. Do not let your hands go behind your  body. 7. Hold for __________ seconds. 8. Slowly return to the starting position. Repeat __________ times. Complete this exercise __________ times a day. Shoulder row 1. Sit in a stable chair without armrests, or stand up. 2. Secure an exercise band to a stable object in front of you so it is at waist height. 3. Hold one end of the exercise band in each hand. Position your palms so that your thumbs are facing the ceiling (neutral position). 4. Bend each of your elbows to a 90-degree angle (right angle) and keep your upper arms at your sides. 5. Step back until the band is tight and there is no slack. 6. Slowly pull your elbows back behind you. 7. Hold for __________ seconds. 8. Slowly return to the starting position. Repeat __________ times. Complete this exercise __________ times a day. Shoulder press-ups  1. Sit in a stable chair that has armrests. Sit upright, with your feet flat on the floor. 2. Put your hands on the armrests so your elbows are bent and your fingers are pointing forward. Your hands should be about even with the sides of your body. 3. Push down on the armrests and use your arms to lift yourself off the chair. Straighten your elbows and lift yourself up as much as you comfortably can. ? Move your shoulder blades down, and avoid letting your shoulders move up toward your ears. ? Keep your feet on the ground. As you get stronger, your feet should support less of your body weight as you lift yourself up. 4. Hold for __________ seconds. 5. Slowly lower yourself back into the chair. Repeat __________ times. Complete this exercise __________ times a day. Wall push-ups  1. Stand so you are facing a stable wall. Your feet should be about one arm-length away from the wall. 2. Lean forward and place your palms on the wall at shoulder height. 3. Keep your feet flat on the floor as you bend your elbows and lean forward toward the wall. 4. Hold for __________ seconds. 5. Straighten  your elbows to push yourself back to the starting position. Repeat __________ times. Complete this exercise __________ times a day. This information is not intended to replace advice given to you by your health care provider. Make sure you discuss any questions you have with your health care provider. Document Revised: 11/15/2018 Document Reviewed: 08/23/2018 Elsevier Patient Education  [place].  Neck Exercises Ask your health care  provider which exercises are safe for you. Do exercises exactly as told by your health care provider and adjust them as directed. It is normal to feel mild stretching, pulling, tightness, or discomfort as you do these exercises. Stop right away if you feel sudden pain or your pain gets worse. Do not begin these exercises until told by your health care provider. Neck exercises can be important for many reasons. They can improve strength and maintain flexibility in your neck, which will help your upper back and prevent neck pain. Stretching exercises Rotation neck stretching  1. Sit in a chair or stand up. 2. Place your feet flat on the floor, shoulder width apart. 3. Slowly turn your head (rotate) to the right until a slight stretch is felt. Turn it all the way to the right so you can look over your right shoulder. Do not tilt or tip your head. 4. Hold this position for 10-30 seconds. 5. Slowly turn your head (rotate) to the left until a slight stretch is felt. Turn it all the way to the left so you can look over your left shoulder. Do not tilt or tip your head. 6. Hold this position for 10-30 seconds. Repeat __________ times. Complete this exercise __________ times a day. Neck retraction 1. Sit in a sturdy chair or stand up. 2. Look straight ahead. Do not bend your neck. 3. Use your fingers to push your chin backward (retraction). Do not bend your neck for this movement. Continue to face straight ahead. If you are doing the exercise properly, you will feel  a slight sensation in your throat and a stretch at the back of your neck. 4. Hold the stretch for 1-2 seconds. Repeat __________ times. Complete this exercise __________ times a day. Strengthening exercises Neck press 1. Lie on your back on a firm bed or on the floor with a pillow under your head. 2. Use your neck muscles to push your head down on the pillow and straighten your spine. 3. Hold the position as well as you can. Keep your head facing up (in a neutral position) and your chin tucked. 4. Slowly count to 5 while holding this position. Repeat __________ times. Complete this exercise __________ times a day. Isometrics These are exercises in which you strengthen the muscles in your neck while keeping your neck still (isometrics). 1. Sit in a supportive chair and place your hand on your forehead. 2. Keep your head and face facing straight ahead. Do not flex or extend your neck while doing isometrics. 3. Push forward with your head and neck while pushing back with your hand. Hold for 10 seconds. 4. Do the sequence again, this time putting your hand against the back of your head. Use your head and neck to push backward against the hand pressure. 5. Finally, do the same exercise on either side of your head, pushing sideways against the pressure of your hand. Repeat __________ times. Complete this exercise __________ times a day. Prone head lifts 1. Lie face-down (prone position), resting on your elbows so that your chest and upper back are raised. 2. Start with your head facing downward, near your chest. Position your chin either on or near your chest. 3. Slowly lift your head upward. Lift until you are looking straight ahead. Then continue lifting your head as far back as you can comfortably stretch. 4. Hold your head up for 5 seconds. Then slowly lower it to your starting position. Repeat __________ times. Complete this exercise __________ times  a day. Supine head lifts 1. Lie on your  back (supine position), bending your knees to point to the ceiling and keeping your feet flat on the floor. 2. Lift your head slowly off the floor, raising your chin toward your chest. 3. Hold for 5 seconds. Repeat __________ times. Complete this exercise __________ times a day. Scapular retraction 1. Stand with your arms at your sides. Look straight ahead. 2. Slowly pull both shoulders (scapulae) backward and downward (retraction) until you feel a stretch between your shoulder blades in your upper back. 3. Hold for 10-30 seconds. 4. Relax and repeat. Repeat __________ times. Complete this exercise __________ times a day. Contact a health care provider if:  Your neck pain or discomfort gets much worse when you do an exercise.  Your neck pain or discomfort does not improve within 2 hours after you exercise. If you have any of these problems, stop exercising right away. Do not do the exercises again unless your health care provider says that you can. Get help right away if:  You develop sudden, severe neck pain. If this happens, stop exercising right away. Do not do the exercises again unless your health care provider says that you can. This information is not intended to replace advice given to you by your health care provider. Make sure you discuss any questions you have with your health care provider. Document Revised: 05/22/2018 Document Reviewed: 05/22/2018 Elsevier Patient Education  [place] DASH stands for "Dietary Approaches to Stop Hypertension." The DASH eating plan is a healthy eating plan that has been shown to reduce high blood pressure (hypertension). It may also reduce your risk for type 2 diabetes, heart disease, and stroke. The DASH eating plan may also help with weight loss. What are tips for following this plan?  General guidelines  Avoid eating more than 2,300 mg (milligrams) of salt (sodium) a day. If you have hypertension, you may  need to reduce your sodium intake to 1,500 mg a day.  Limit alcohol intake to no more than 1 drink a day for nonpregnant women and 2 drinks a day for men. One drink equals 12 oz of beer, 5 oz of wine, or 1 oz of hard liquor.  Work with your health care provider to maintain a healthy body weight or to lose weight. Ask what an ideal weight is for you.  Get at least 30 minutes of exercise that causes your heart to beat faster (aerobic exercise) most days of the week. Activities may include walking, swimming, or biking.  Work with your health care provider or diet and nutrition specialist (dietitian) to adjust your eating plan to your individual calorie needs. Reading food labels   Check food labels for the amount of sodium per serving. Choose foods with less than 5 percent of the Daily Value of sodium. Generally, foods with less than 300 mg of sodium per serving fit into this eating plan.  To find whole grains, look for the word "whole" as the first word in the ingredient list. Shopping  Buy products labeled as "low-sodium" or "no salt added."  Buy fresh foods. Avoid canned foods and premade or frozen meals. Cooking  Avoid adding salt when cooking. Use salt-free seasonings or herbs instead of table salt or sea salt. Check with your health care provider or pharmacist before using salt substitutes.  Do not fry foods. Cook foods using healthy methods such as baking, boiling, grilling, and broiling instead.  Adriana Cobb  with heart-healthy oils, such as olive, canola, soybean, or sunflower oil. Meal planning  Eat a balanced diet that includes: ? 5 or more servings of fruits and vegetables each day. At each meal, try to fill half of your plate with fruits and vegetables. ? Up to 6-8 servings of whole grains each day. ? Less than 6 oz of lean meat, poultry, or fish each day. A 3-oz serving of meat is about the same size as a deck of cards. One egg equals 1 oz. ? 2 servings of low-fat dairy each  day. ? A serving of nuts, seeds, or beans 5 times each week. ? Heart-healthy fats. Healthy fats called Omega-3 fatty acids are found in foods such as flaxseeds and coldwater fish, like sardines, salmon, and mackerel.  Limit how much you eat of the following: ? Canned or prepackaged foods. ? Food that is high in trans fat, such as fried foods. ? Food that is high in saturated fat, such as fatty meat. ? Sweets, desserts, sugary drinks, and other foods with added sugar. ? Full-fat dairy products.  Do not salt foods before eating.  Try to eat at least 2 vegetarian meals each week.  Eat more home-cooked food and less restaurant, buffet, and fast food.  When eating at a restaurant, ask that your food be prepared with less salt or no salt, if possible. What foods are recommended? The items listed may not be a complete list. Talk with your dietitian about what dietary choices are best for you. Grains Whole-grain or whole-wheat bread. Whole-grain or whole-wheat pasta. Brown rice. Adriana Cobb. Bulgur. Whole-grain and low-sodium cereals. Pita bread. Low-fat, low-sodium crackers. Whole-wheat flour tortillas. Vegetables Fresh or frozen vegetables (raw, steamed, roasted, or grilled). Low-sodium or reduced-sodium tomato and vegetable juice. Low-sodium or reduced-sodium tomato sauce and tomato paste. Low-sodium or reduced-sodium canned vegetables. Fruits All fresh, dried, or frozen fruit. Canned fruit in natural juice (without added sugar). Meat and other protein foods Skinless chicken or [place]. Ground chicken or [place]. Pork with fat trimmed off. Fish and seafood. Egg whites. Dried beans, peas, or lentils. Unsalted nuts, nut butters, and seeds. Unsalted canned beans. Lean cuts of beef with fat trimmed off. Low-sodium, lean deli meat. Dairy Low-fat (1%) or fat-free (skim) milk. Fat-free, low-fat, or reduced-fat cheeses. Nonfat, low-sodium ricotta or cottage cheese. Low-fat or nonfat yogurt.  Low-fat, low-sodium cheese. Fats and oils Soft margarine without trans fats. Vegetable oil. Low-fat, reduced-fat, or light mayonnaise and salad dressings (reduced-sodium). Canola, safflower, olive, soybean, and sunflower oils. Avocado. Seasoning and other foods Herbs. Spices. Seasoning mixes without salt. Unsalted popcorn and pretzels. Fat-free sweets. What foods are not recommended? The items listed may not be a complete list. Talk with your dietitian about what dietary choices are best for you. Grains Baked goods made with fat, such as croissants, muffins, or some breads. Dry pasta or rice meal packs. Vegetables Creamed or fried vegetables. Vegetables in a cheese sauce. Regular canned vegetables (not low-sodium or reduced-sodium). Regular canned tomato sauce and paste (not low-sodium or reduced-sodium). Regular tomato and vegetable juice (not low-sodium or reduced-sodium). Adriana Cobb. Olives. Fruits Canned fruit in a light or heavy syrup. Fried fruit. Fruit in cream or butter sauce. Meat and other protein foods Fatty cuts of meat. Ribs. Fried meat. Adriana Cobb. Sausage. Bologna and other processed lunch meats. Salami. Fatback. Hotdogs. Bratwurst. Salted nuts and seeds. Canned beans with added salt. Canned or smoked fish. Whole eggs or egg yolks. Chicken or [place] with skin. Dairy Whole or  2% milk, cream, and half-and-half. Whole or full-fat cream cheese. Whole-fat or sweetened yogurt. Full-fat cheese. Nondairy creamers. Whipped toppings. Processed cheese and cheese spreads. Fats and oils Butter. Stick margarine. Lard. Shortening. Ghee. Bacon fat. Tropical oils, such as coconut, palm kernel, or palm oil. Seasoning and other foods Salted popcorn and pretzels. Onion salt, garlic salt, seasoned salt, table salt, and sea salt. Worcestershire sauce. Tartar sauce. Barbecue sauce. Teriyaki sauce. Soy sauce, including reduced-sodium. Steak sauce. Canned and packaged gravies. Fish sauce. Oyster sauce. Cocktail  sauce. Horseradish that you find on the shelf. Ketchup. Mustard. Meat flavorings and tenderizers. Bouillon cubes. Hot sauce and Tabasco sauce. Premade or packaged marinades. Premade or packaged taco seasonings. Relishes. Regular salad dressings. Where to find more information:  National Heart, Lung, and [place]: https://wilson-eaton.com/  American Heart Association: www.heart.org Summary  The DASH eating plan is a healthy eating plan that has been shown to reduce high blood pressure (hypertension). It may also reduce your risk for type 2 diabetes, heart disease, and stroke.  With the DASH eating plan, you should limit salt (sodium) intake to 2,300 mg a day. If you have hypertension, you may need to reduce your sodium intake to 1,500 mg a day.  When on the DASH eating plan, aim to eat more fresh fruits and vegetables, whole grains, lean proteins, low-fat dairy, and heart-healthy fats.  Work with your health care provider or diet and nutrition specialist (dietitian) to adjust your eating plan to your individual calorie needs. This information is not intended to replace advice given to you by your health care provider. Make sure you discuss any questions you have with your health care provider. Document Revised: 07/06/2017 Document Reviewed: 07/17/2016 Elsevier Patient Education  2020 [employer]. Consider shingrix vaccine at work    Dr. Janese Banks   https://www.cdc.gov/vaccines/hcp/vis/vis-statements/tdap.pdf">  Tdap (Tetanus, Diphtheria, Pertussis) Vaccine: What You Need to Know 1. Why get vaccinated? Tdap vaccine can prevent tetanus, diphtheria, and pertussis. Diphtheria and pertussis spread from person to person. Tetanus enters the body through cuts or wounds.  TETANUS (T) causes painful stiffening of the muscles. Tetanus can lead to serious health problems, including being unable to open the mouth, having trouble swallowing and breathing, or death.  DIPHTHERIA (D) can lead to difficulty  breathing, heart failure, paralysis, or death.  PERTUSSIS (aP), also known as "whooping cough," can cause uncontrollable, violent coughing which makes it hard to breathe, eat, or drink. Pertussis can be extremely serious in babies and young children, causing pneumonia, convulsions, brain damage, or death. In teens and adults, it can cause weight loss, loss of bladder control, passing out, and rib fractures from severe coughing. 2. Tdap vaccine Tdap is only for children 7 years and older, adolescents, and adults.  Adolescents should receive a single dose of Tdap, preferably at age 22 or 66 years. Pregnant women should get a dose of Tdap during every pregnancy, to protect the newborn from pertussis. Infants are most at risk for severe, life-threatening complications from pertussis. Adults who have never received Tdap should get a dose of Tdap. Also, adults should receive a booster dose every 10 years, or earlier in the case of a severe and dirty wound or burn. Booster doses can be either Tdap or Td (a different vaccine that protects against tetanus and diphtheria but not pertussis). Tdap may be given at the same time as other vaccines. 3. Talk with your health care provider Tell your vaccine provider if the person getting the vaccine:  Has had  an allergic reaction after a previous dose of any vaccine that protects against tetanus, diphtheria, or pertussis, or has any severe, life-threatening allergies.  Has had a coma, decreased level of consciousness, or prolonged seizures within 7 days after a previous dose of any pertussis vaccine (DTP, DTaP, or Tdap).  Has seizures or another nervous system problem.  Has ever had Guillain-Barr Syndrome (also called GBS).  Has had severe pain or swelling after a previous dose of any vaccine that protects against tetanus or diphtheria. In some cases, your health care provider may decide to postpone Tdap vaccination to a future visit.  People with minor  illnesses, such as a cold, may be vaccinated. People who are moderately or severely ill should usually wait until they recover before getting Tdap vaccine.  Your health care provider can give you more information. 4. Risks of a vaccine reaction  Pain, redness, or swelling where the shot was given, mild fever, headache, feeling tired, and nausea, vomiting, diarrhea, or stomachache sometimes happen after Tdap vaccine. People sometimes faint after medical procedures, including vaccination. Tell your provider if you feel dizzy or have vision changes or ringing in the ears.  As with any medicine, there is a very remote chance of a vaccine causing a severe allergic reaction, other serious injury, or death. 5. What if there is a serious problem? An allergic reaction could occur after the vaccinated person leaves the clinic. If you see signs of a severe allergic reaction (hives, swelling of the face and throat, difficulty breathing, a fast heartbeat, dizziness, or weakness), call 9-1-1 and get the person to the nearest hospital. For other signs that concern you, call your health care provider.  Adverse reactions should be reported to the Vaccine Adverse Event Reporting System (VAERS). Your health care provider will usually file this report, or you can do it yourself. Visit the VAERS website at www.vaers.SamedayNews.es or call 458 169 2565. VAERS is only for reporting reactions, and VAERS staff do not give medical advice. 6. The National Vaccine Injury Compensation Program The [employer] Vaccine Injury Compensation Program (VICP) is a federal program that was created to compensate people who may have been injured by certain vaccines. Visit the VICP website at GoldCloset.com.ee or call 782 277 1661 to learn about the program and about filing a claim. There is a time limit to file a claim for compensation. 7. How can I learn more?  Ask your health care provider.  Call your local or state health  department.  Contact the Centers for Disease Control and Prevention (CDC): ? Call 714 232 0473 (1-800-CDC-INFO) or ? Visit CDC's website at http://hunter.com/ Vaccine Information Statement Tdap (Tetanus, Diphtheria, Pertussis) Vaccine (11/06/2018) This information is not intended to replace advice given to you by your health care provider. Make sure you discuss any questions you have with your health care provider. Document Revised: 11/15/2018 Document Reviewed: 11/18/2018 Elsevier Patient Education  [place].  Recombinant Zoster (Shingles) Vaccine: What You Need to Know 1. Why get vaccinated? Recombinant zoster (shingles) vaccine can prevent shingles. Shingles (also called herpes zoster, or just zoster) is a painful skin rash, usually with blisters. In addition to the rash, shingles can cause fever, headache, chills, or upset stomach. More rarely, shingles can lead to pneumonia, hearing problems, blindness, brain inflammation (encephalitis), or death. The most common complication of shingles is long-term nerve pain called postherpetic neuralgia (PHN). PHN occurs in the areas where the shingles rash was, even after the rash clears up. It can last for months or years after  the rash goes away. The pain from PHN can be severe and debilitating. About 10 to 18% of people who get shingles will experience PHN. The risk of PHN increases with age. An older adult with shingles is more likely to develop PHN and have longer lasting and more severe pain than a younger person with shingles. Shingles is caused by the varicella zoster virus, the same virus that causes chickenpox. After you have chickenpox, the virus stays in your body and can cause shingles later in life. Shingles cannot be passed from one person to another, but the virus that causes shingles can spread and cause chickenpox in someone who had never had chickenpox or received chickenpox vaccine. 2. Recombinant shingles  vaccine Recombinant shingles vaccine provides strong protection against shingles. By preventing shingles, recombinant shingles vaccine also protects against PHN. Recombinant shingles vaccine is the preferred vaccine for the prevention of shingles. However, a different vaccine, live shingles vaccine, may be used in some circumstances. The recombinant shingles vaccine is recommended for adults 50 years and older without serious immune problems. It is given as a two-dose series. This vaccine is also recommended for people who have already gotten another type of shingles vaccine, the live shingles vaccine. There is no live virus in this vaccine. Shingles vaccine may be given at the same time as other vaccines. 3. Talk with your health care provider Tell your vaccine provider if the person getting the vaccine:  Has had an allergic reaction after a previous dose of recombinant shingles vaccine, or has any severe, life-threatening allergies.  Is pregnant or breastfeeding.  Is currently experiencing an episode of shingles. In some cases, your health care provider may decide to postpone shingles vaccination to a future visit. People with minor illnesses, such as a cold, may be vaccinated. People who are moderately or severely ill should usually wait until they recover before getting recombinant shingles vaccine. Your health care provider can give you more information. 4. Risks of a vaccine reaction  A sore arm with mild or moderate pain is very common after recombinant shingles vaccine, affecting about 80% of vaccinated people. Redness and swelling can also happen at the site of the injection.  Tiredness, muscle pain, headache, shivering, fever, stomach pain, and nausea happen after vaccination in more than half of people who receive recombinant shingles vaccine. In clinical trials, about 1 out of 6 people who got recombinant zoster vaccine experienced side effects that prevented them from doing regular  activities. Symptoms usually went away on their own in 2 to 3 days. You should still get the second dose of recombinant zoster vaccine even if you had one of these reactions after the first dose. People sometimes faint after medical procedures, including vaccination. Tell your provider if you feel dizzy or have vision changes or ringing in the ears. As with any medicine, there is a very remote chance of a vaccine causing a severe allergic reaction, other serious injury, or death. 5. What if there is a serious problem? An allergic reaction could occur after the vaccinated person leaves the clinic. If you see signs of a severe allergic reaction (hives, swelling of the face and throat, difficulty breathing, a fast heartbeat, dizziness, or weakness), call 9-1-1 and get the person to the nearest hospital. For other signs that concern you, call your health care provider. Adverse reactions should be reported to the Vaccine Adverse Event Reporting System (VAERS). Your health care provider will usually file this report, or you can do it yourself.  Visit the VAERS website at www.vaers.SamedayNews.es or call 5648218081. VAERS is only for reporting reactions, and VAERS staff do not give medical advice. 6. How can I learn more?  Ask your health care provider.  Call your local or state health department.  Contact the Centers for Disease Control and Prevention (CDC): ? Call (309)008-8462 (1-800-CDC-INFO) or ? Visit CDC's website at http://hunter.com/ Vaccine Information Statement Recombinant Zoster Vaccine (06/05/2018) This information is not intended to replace advice given to you by your health care provider. Make sure you discuss any questions you have with your health care provider. Document Revised: 11/12/2018 Document Reviewed: 02/27/2018 Elsevier Patient Education  [place].    Hypokalemia Hypokalemia means that the amount of potassium in the blood is lower than normal. Potassium is a  chemical (electrolyte) that helps regulate the amount of fluid in the body. It also stimulates muscle tightening (contraction) and helps nerves work properly. Normally, most of the body's potassium is inside cells, and only a very small amount is in the blood. Because the amount in the blood is so small, minor changes to potassium levels in the blood can be life-threatening. What are the causes? This condition may be caused by:  Antibiotic medicine.  Diarrhea or vomiting. Taking too much of a medicine that helps you have a bowel movement (laxative) can cause diarrhea and lead to hypokalemia.  Chronic kidney disease (CKD).  Medicines that help the body get rid of excess fluid (diuretics).  Eating disorders, such as bulimia.  Low magnesium levels in the body.  Sweating a lot. What are the signs or symptoms? Symptoms of this condition include:  Weakness.  Constipation.  Fatigue.  Muscle cramps.  Mental confusion.  Skipped heartbeats or irregular heartbeat (palpitations).  Tingling or numbness. How is this diagnosed? This condition is diagnosed with a blood test. How is this treated? This condition may be treated by:  Taking potassium supplements by mouth.  Adjusting the medicines that you take.  Eating more foods that contain a lot of potassium. If your potassium level is very low, you may need to get potassium through an IV and be monitored in the hospital. Follow these instructions at home:   Take over-the-counter and prescription medicines only as told by your health care provider. This includes vitamins and supplements.  Eat a healthy diet. A healthy diet includes fresh fruits and vegetables, whole grains, healthy fats, and lean proteins.  If instructed, eat more foods that contain a lot of potassium. This includes: ? Nuts, such as peanuts and pistachios. ? Seeds, such as sunflower seeds and pumpkin seeds. ? Peas, lentils, and lima beans. ? Whole grain and  bran cereals and breads. ? Fresh fruits and vegetables, such as apricots, avocado, bananas, cantaloupe, kiwi, oranges, tomatoes, asparagus, and potatoes. ? Orange juice. ? Tomato juice. ? Red meats. ? Yogurt.  Keep all follow-up visits as told by your health care provider. This is important. Contact a health care provider if you:  Have weakness that gets worse.  Feel your heart pounding or racing.  Vomit.  Have diarrhea.  Have diabetes (diabetes mellitus) and you have trouble keeping your blood sugar (glucose) in your target range. Get help right away if you:  Have chest pain.  Have shortness of breath.  Have vomiting or diarrhea that lasts for more than 2 days.  Faint. Summary  Hypokalemia means that the amount of potassium in the blood is lower than normal.  This condition is diagnosed with a blood test.  Hypokalemia may be treated by taking potassium supplements, adjusting the medicines that you take, or eating more foods that are high in potassium.  If your potassium level is very low, you may need to get potassium through an IV and be monitored in the hospital. This information is not intended to replace advice given to you by your health care provider. Make sure you discuss any questions you have with your health care provider. Document Revised: 03/06/2018 Document Reviewed: 03/06/2018 Elsevier Patient Education  [place].

## 2020-01-13 NOTE — Progress Notes (Signed)
Chief Complaint  Patient presents with  . Follow-up   F/u  1. HTN controlled on hctz 25 and benicar 20 not allergic benicar and tolerating; HLD controlled on zocor 40 mg qhs 2. Diverticulitis resolved colonoscopy 10/2019 diverticulosis Dr. Doreatha Lew f/u in 10 years no ab pain  3. Chronic neck and shoulder pain s/p injection  4. Hypothyroidism on levo 75 mcg qd  5. H/o breast cancer rec f/u with oncology 06/19/2019 CA 27.29 negative, and CA 125 never obtained result    Review of Systems  Constitutional: Negative for weight loss.  HENT: Negative for hearing loss.   Eyes: Negative for blurred vision.  Respiratory: Negative for shortness of breath.   Cardiovascular: Negative for chest pain.  Gastrointestinal: Negative for abdominal pain.  Musculoskeletal: Positive for neck pain. Negative for falls.  Neurological: Negative for headaches.  Psychiatric/Behavioral: Negative for depression.   Past Medical History:  Diagnosis Date  . BRCA negative 2016   MyRisk neg  . 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   . Cancer ([place]) 2006   pT2,N2a,Mx; ER/PR negative, Her 2 neu: 3+ ([facility]). Dermal lymphatic involvement, 9/ 22 nodes positive with extranodal extension.  Treated with Cytoxan, Adriamycin, Taxol and Herceptin.  . H/O cystitis   . Hemorrhoids   . Hot flashes   . Hx of migraines   . Hyperlipidemia   . Hypothyroid   . Osteopenia 11/2017   [facility] at [facility]  . Palpitations   . Paroxysmal supraventricular tachycardia ([place])   . Personal history of tobacco use, presenting hazards to health   . Skin cancer    nmsc Dr. Kellie Moor   . Unspecified essential hypertension   . Unspecified hypothyroidism    Past Surgical History:  Procedure Laterality Date  . BREAST BIOPSY Left 2006   stereo breast bx  . BREAST RECONSTRUCTION Bilateral 06/08/14  . [place]   x 2  . COLONOSCOPY  2007   Dr. Allen Norris  . COLONOSCOPY WITH PROPOFOL N/A  10/22/2019   Procedure: COLONOSCOPY WITH PROPOFOL;  Surgeon: Robert Bellow, MD;  Location: [facility] ENDOSCOPY;  Service: Endoscopy;  Laterality: N/A;  . MASTECTOMY Left 2006   left breast  . MASTECTOMY  2015   right breast    Family History  Problem Relation Age of Onset  . Cancer Other        breast cancer  . Hypertension Mother    Social History   Socioeconomic History  . Marital status: Married    Spouse name: Not on file  . Number of children: Not on file  . Years of education: Not on file  . Highest education level: Not on file  Occupational History  . Not on file  Tobacco Use  . Smoking status: Former Smoker    Packs/day: 1.00    Years: 10.00    Pack years: 10.00    Types: Cigarettes    Quit date: 09/18/1978    Years since quitting: 41.3  . Smokeless tobacco: Never Used  Substance and Sexual Activity  . Alcohol use: Yes    Comment: bottle a wine weekly  . Drug use: No  . Sexual activity: Not on file  Other Topics Concern  . 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 [employer]   Married    No guns, wears seat belt, safe in relationship    2 kids    Former  smoker    Social Determinants of Radio broadcast assistant Strain:   . Difficulty of Paying Living Expenses:   Food Insecurity:   . Worried About Charity fundraiser in the Last Year:   . Arboriculturist in the Last Year:   Transportation Needs:   . Film/video editor (Medical):   Marland Kitchen Lack of Transportation (Non-Medical):   Physical Activity:   . Days of Exercise per Week:   . Minutes of Exercise per Session:   Stress:   . Feeling of Stress :   Social Connections:   . Frequency of Communication with Friends and Family:   . Frequency of Social Gatherings with Friends and Family:   . Attends Religious Services:   . Active Member of Clubs or Organizations:   . Attends Archivist Meetings:   Marland Kitchen Marital Status:   Intimate Partner Violence:   . Fear of Current or  Ex-Partner:   . Emotionally Abused:   Marland Kitchen Physically Abused:   . Sexually Abused:    Current Meds  Medication Sig  . Cholecalciferol (VITAMIN D PO) Take 1 tablet by mouth as needed. 2000 IU daily  . citalopram (CELEXA) 20 MG tablet Take 1 tablet (20 mg total) by mouth daily.  . [employer] CALCIUM PO Take by mouth.  . cyanocobalamin 2000 MCG tablet Take 2,000 mcg by mouth daily.  . hydrochlorothiazide (HYDRODIURIL) 25 MG tablet Take 1 tablet (25 mg total) by mouth daily. In am  . levothyroxine (SYNTHROID) 75 MCG tablet TAKE ONE TABLET BY MOUTH ONCE DAILY, TAKE ON AN EMPTY STOMACH AND WAIT 30 minutes before food  . olmesartan (BENICAR) 20 MG tablet Take 1 tablet (20 mg total) by mouth daily.  Marland Kitchen olmesartan (BENICAR) 20 MG tablet Take 1 tablet (20 mg total) by mouth daily. No allergy to medication can tolerate. appt further refills  . simvastatin (ZOCOR) 40 MG tablet Take 1 tablet (40 mg total) by mouth daily at 6 PM.  . [DISCONTINUED] hydrochlorothiazide (HYDRODIURIL) 25 MG tablet Take 0.5-1 tablets (12.5-25 mg total) by mouth daily. In am  . [DISCONTINUED] olmesartan (BENICAR) 20 MG tablet Take 1 tablet (20 mg total) by mouth daily. appt further refills   Allergies  Allergen Reactions  . Enalapril     Cough   . Other Other (See Comments)    Medications ending in 'pril'; such as ace inhibitors   Recent Results (from the past 2160 hour(s))  SARS CORONAVIRUS 2 (TAT 6-24 HRS) Nasopharyngeal Nasopharyngeal Swab     Status: None   Collection Time: 10/20/19 11:16 AM   Specimen: Nasopharyngeal Swab  Result Value Ref Range   SARS Coronavirus 2 NEGATIVE NEGATIVE    Comment: (NOTE) SARS-CoV-2 target nucleic acids are NOT DETECTED. The SARS-CoV-2 RNA is generally detectable in upper and lower respiratory specimens during the acute phase of infection. Negative results do not preclude SARS-CoV-2 infection, do not rule out co-infections with other pathogens, and should not be used as the sole basis for  treatment or other patient management decisions. Negative results must be combined with clinical observations, patient history, and epidemiological information. The expected result is Negative. Fact Sheet for Patients: SugarRoll.be Fact Sheet for Healthcare Providers: https://www.woods-mathews.com/ This test is not yet approved or cleared by the [place] FDA and  has been authorized for detection and/or diagnosis of SARS-CoV-2 by FDA under an Emergency Use Authorization (EUA). This EUA will remain  in effect (meaning this test can be used) for the  duration of the COVID-19 declaration under Section 56 4(b)(1) of the Act, 21 U.S.C. section 360bbb-3(b)(1), unless the authorization is terminated or revoked sooner. Performed at [facility], [place] [address], [place], [place] [address]    Objective  Body mass index is 29.29 kg/m. Wt Readings from Last 3 Encounters:  01/13/20 181 lb 6.4 oz (82.3 kg)  10/22/19 190 lb (86.2 kg)  08/28/19 180 lb (81.6 kg)   Temp Readings from Last 3 Encounters:  01/13/20 (!) 97.3 F (36.3 C)  10/22/19 97.6 F (36.4 C) (Temporal)  07/01/19 97.7 F (36.5 C) (Temporal)   BP Readings from Last 3 Encounters:  01/13/20 (!) 110/58  10/22/19 97/65  07/01/19 (!) 152/85   Pulse Readings from Last 3 Encounters:  01/13/20 80  10/22/19 75  07/01/19 73    Physical Exam Vitals and nursing note reviewed.  Constitutional:      Appearance: Normal appearance. She is well-developed, well-groomed and overweight.  HENT:     Head: Normocephalic and atraumatic.  Eyes:     Conjunctiva/sclera: Conjunctivae normal.     Pupils: Pupils are equal, round, and reactive to light.  Cardiovascular:     Rate and Rhythm: Normal rate and regular rhythm.     Heart sounds: Normal heart sounds. No murmur.  Pulmonary:     Effort: Pulmonary effort is normal.     Breath sounds: Normal breath sounds.  Skin:    General: Skin is  warm and dry.  Neurological:     General: No focal deficit present.     Mental Status: She is alert and oriented to person, place, and time. Mental status is at baseline.     Gait: Gait normal.  Psychiatric:        Attention and Perception: Attention and perception normal.        Mood and Affect: Mood and affect normal.        Speech: Speech normal.        Behavior: Behavior normal. Behavior is cooperative.        Thought Content: Thought content normal.        Cognition and Memory: Cognition and memory normal.        Judgment: Judgment normal.     Assessment  Plan  Essential hypertension - Plan: olmesartan (BENICAR) 20 MG tablet, hydrochlorothiazide (HYDRODIURIL) 25 MG tablet, Comprehensive metabolic panel, CBC with Differential/Platelet, Lipid panel [facility] at work  Liver cyst On CT ab/pelvis 06/2019    Hypothyroidism, unspecified type - Plan: TSH Cont meds   Bilateral malignant neoplasm of breast in female, unspecified estrogen receptor status, unspecified site of breast ([place]) - Plan: CA 125, Ambulatory referral to Oncology f/u Dr. Janese Banks  Cancer [facility]) - Plan: CA 125, Ambulatory referral to Oncology  Hyperlipidemia, unspecified hyperlipidemia type Cont zocor    [facility] Labs 01/2020 with [facility] [place] Flu shot utd per pt had at work 06/2019  moderna 2/2  Given Rx tdap and shingrix today  01/13/20   Consider Tdap, shingrix, prevnar, pna 23 in future MMR immune  rec hep B and neg hep C  TSH nl 08/2018  Labs 06/24/2019 [facility] 1 of liver enzymes slightly elevated ALT 36  Cholesterol normal  Thyroid labs normal  Blood cts normal  A1c 5.3 no prediabetes   CA 125 cancer marker pending need to call [facility] never sent copy of final report  CA 27.29 normal   Vitamin D 33 rec D3 2000 IU daily   Reviewed labs 06/12/18 CMET  nl phos 4.6 sl high 4.5 normal, tc 164, tg 104, HDL 51, LDL 92, TSH 4.970 (elevated nl 4.50, CBC 4.3/13.6/39.2/242, CA 125 6.1, vitamin D 39.1, CA  27.29 11.3   Urine normal 09/02/2018 TSH nl 1.710  S/p b/l mastectomy for breast cancer   dexa 11/12/17 osteopenia on vitamin D3 2000 IU daily   Colonoscopy 02/11/14 normal repeat in 10 years but CT 06/24/2019 with acute diverticulitis  colonosocpy 10/22/19 Dr. Bary Castilla f/u in 10 years   Pap [facility] 09/11/17 negative neg HPV   Dermatology as of 07/09/19 appt pending [facility] next appt 01/29/20   Eye MD [facility]  D[facility]  Surgery Dr. Bary Castilla  Dermatology Dr. Kellie Moor h/o SCC or NMSC f/u in 6 months; leg    Provider: Dr. Olivia Mackie McLean-Scocuzza-Internal Medicine

## 2020-01-14 ENCOUNTER — Ambulatory Visit: Payer: BC Managed Care – PPO | Admitting: Internal Medicine

## 2020-01-16 ENCOUNTER — Encounter: Payer: Self-pay | Admitting: Internal Medicine

## 2020-01-16 DIAGNOSIS — Z23 Encounter for immunization: Secondary | ICD-10-CM | POA: Diagnosis not present

## 2020-01-16 DIAGNOSIS — Z0189 Encounter for other specified special examinations: Secondary | ICD-10-CM | POA: Diagnosis not present

## 2020-01-21 ENCOUNTER — Ambulatory Visit: Payer: BC Managed Care – PPO | Admitting: Internal Medicine

## 2020-01-21 ENCOUNTER — Telehealth: Payer: Self-pay | Admitting: Internal Medicine

## 2020-01-21 NOTE — Telephone Encounter (Signed)
01/16/20 lab results  Blood cts normal wbc 5.1/ H/H 14.2/41.5 plts 234   Glucose 92 normal  Creatinine 0.85 GFR 71  K 4.0 AST 33 slightly high  -1 of liver enzymes is she drinking alcohol   Total cholesterol 186, Trigs 96, HDL 54, LDL 115 slightly high  -mail cholesterol handout   CA 125 cancer test 5.5 normal, prior 06/12/18 6.1   TSH 3.550 normal   Take care  [place]

## 2020-01-21 NOTE — Telephone Encounter (Signed)
Patient informed and verbalized understanding.  Mailed handout. Patient states she drinks wine.

## 2020-01-21 NOTE — Telephone Encounter (Signed)
Patient is not allergic to Olmesartan. Received a communication from [facility] asking for verification of this.   Verified this with the Patient and faxed form back in.

## 2020-01-22 ENCOUNTER — Telehealth: Payer: Self-pay | Admitting: Internal Medicine

## 2020-01-22 ENCOUNTER — Inpatient Hospital Stay: Payer: BC Managed Care – PPO | Attending: Oncology | Admitting: Oncology

## 2020-01-22 ENCOUNTER — Inpatient Hospital Stay: Payer: BC Managed Care – PPO

## 2020-01-22 DIAGNOSIS — Z79899 Other long term (current) drug therapy: Secondary | ICD-10-CM | POA: Insufficient documentation

## 2020-01-22 DIAGNOSIS — Z853 Personal history of malignant neoplasm of breast: Secondary | ICD-10-CM | POA: Insufficient documentation

## 2020-01-22 DIAGNOSIS — K7689 Other specified diseases of liver: Secondary | ICD-10-CM | POA: Insufficient documentation

## 2020-01-22 DIAGNOSIS — E785 Hyperlipidemia, unspecified: Secondary | ICD-10-CM | POA: Insufficient documentation

## 2020-01-22 DIAGNOSIS — F419 Anxiety disorder, unspecified: Secondary | ICD-10-CM | POA: Insufficient documentation

## 2020-01-22 DIAGNOSIS — M79622 Pain in left upper arm: Secondary | ICD-10-CM | POA: Insufficient documentation

## 2020-01-22 DIAGNOSIS — M542 Cervicalgia: Secondary | ICD-10-CM | POA: Insufficient documentation

## 2020-01-22 DIAGNOSIS — Z9013 Acquired absence of bilateral breasts and nipples: Secondary | ICD-10-CM | POA: Insufficient documentation

## 2020-01-22 DIAGNOSIS — M8580 Other specified disorders of bone density and structure, unspecified site: Secondary | ICD-10-CM | POA: Insufficient documentation

## 2020-01-22 DIAGNOSIS — R232 Flushing: Secondary | ICD-10-CM | POA: Insufficient documentation

## 2020-01-22 DIAGNOSIS — E039 Hypothyroidism, unspecified: Secondary | ICD-10-CM | POA: Insufficient documentation

## 2020-01-22 DIAGNOSIS — I10 Essential (primary) hypertension: Secondary | ICD-10-CM | POA: Insufficient documentation

## 2020-01-22 DIAGNOSIS — Z87891 Personal history of nicotine dependence: Secondary | ICD-10-CM | POA: Insufficient documentation

## 2020-01-22 DIAGNOSIS — I471 Supraventricular tachycardia: Secondary | ICD-10-CM | POA: Insufficient documentation

## 2020-01-22 NOTE — Telephone Encounter (Signed)
Faxed drug utilization request for Olmesartan Mexdoxomil tabs 20 mg to [facility]  6-17-21I

## 2020-01-29 ENCOUNTER — Other Ambulatory Visit: Payer: Self-pay

## 2020-01-29 ENCOUNTER — Inpatient Hospital Stay (HOSPITAL_BASED_OUTPATIENT_CLINIC_OR_DEPARTMENT_OTHER): Payer: BC Managed Care – PPO | Admitting: Oncology

## 2020-01-29 ENCOUNTER — Inpatient Hospital Stay: Payer: BC Managed Care – PPO

## 2020-01-29 ENCOUNTER — Encounter: Payer: Self-pay | Admitting: Oncology

## 2020-01-29 DIAGNOSIS — K7689 Other specified diseases of liver: Secondary | ICD-10-CM | POA: Diagnosis not present

## 2020-01-29 DIAGNOSIS — M79622 Pain in left upper arm: Secondary | ICD-10-CM | POA: Diagnosis not present

## 2020-01-29 DIAGNOSIS — L570 Actinic keratosis: Secondary | ICD-10-CM | POA: Diagnosis not present

## 2020-01-29 DIAGNOSIS — E039 Hypothyroidism, unspecified: Secondary | ICD-10-CM | POA: Diagnosis not present

## 2020-01-29 DIAGNOSIS — D225 Melanocytic nevi of trunk: Secondary | ICD-10-CM | POA: Diagnosis not present

## 2020-01-29 DIAGNOSIS — I10 Essential (primary) hypertension: Secondary | ICD-10-CM | POA: Diagnosis not present

## 2020-01-29 DIAGNOSIS — Z9013 Acquired absence of bilateral breasts and nipples: Secondary | ICD-10-CM | POA: Diagnosis not present

## 2020-01-29 DIAGNOSIS — Z853 Personal history of malignant neoplasm of breast: Secondary | ICD-10-CM | POA: Diagnosis not present

## 2020-01-29 DIAGNOSIS — Z85828 Personal history of other malignant neoplasm of skin: Secondary | ICD-10-CM | POA: Diagnosis not present

## 2020-01-29 DIAGNOSIS — I471 Supraventricular tachycardia: Secondary | ICD-10-CM | POA: Diagnosis not present

## 2020-01-29 DIAGNOSIS — X32XXXA Exposure to sunlight, initial encounter: Secondary | ICD-10-CM | POA: Diagnosis not present

## 2020-01-29 DIAGNOSIS — R232 Flushing: Secondary | ICD-10-CM | POA: Diagnosis not present

## 2020-01-29 DIAGNOSIS — M8580 Other specified disorders of bone density and structure, unspecified site: Secondary | ICD-10-CM | POA: Diagnosis not present

## 2020-01-29 DIAGNOSIS — M542 Cervicalgia: Secondary | ICD-10-CM | POA: Diagnosis not present

## 2020-01-29 DIAGNOSIS — D2261 Melanocytic nevi of right upper limb, including shoulder: Secondary | ICD-10-CM | POA: Diagnosis not present

## 2020-01-29 DIAGNOSIS — D2262 Melanocytic nevi of left upper limb, including shoulder: Secondary | ICD-10-CM | POA: Diagnosis not present

## 2020-01-29 DIAGNOSIS — F419 Anxiety disorder, unspecified: Secondary | ICD-10-CM | POA: Diagnosis not present

## 2020-01-29 DIAGNOSIS — E785 Hyperlipidemia, unspecified: Secondary | ICD-10-CM | POA: Diagnosis not present

## 2020-01-29 DIAGNOSIS — Z79899 Other long term (current) drug therapy: Secondary | ICD-10-CM | POA: Diagnosis not present

## 2020-01-29 DIAGNOSIS — Z87891 Personal history of nicotine dependence: Secondary | ICD-10-CM | POA: Diagnosis not present

## 2020-01-31 NOTE — Progress Notes (Signed)
Hematology/Oncology Consult note [facility] [phone] [phone]  Patient Care Team: McLean-Scocuzza, Nino Glow, MD as PCP - General (Internal Medicine) Minna Merritts, MD as PCP - Cardiology (Cardiology) Christene Lye, MD (General Surgery) Perrin Maltese, MD as Referring Physician (Internal Medicine) Minna Merritts, MD as Consulting Physician (Cardiology)   Name of the patient: Adriana Cobb  [id number]  Apr 27, 1951    Reason for referral-history of breast cancer   Referring physician-Dr. Mclean-Scocuzza  Date of visit: 01/31/20   History of presenting illness- Patient is a 69 year old female with a prior history of equal and a MX ER/PR negative HER-2/neu positive breast cancer that was diagnosed in 2006.  She had 9 nodes positive with extranodal extension of time and was treated with Adriamycin Cytoxan Taxol and Herceptin.  Genetic testing was negative.  She subsequently had a right breast DCIS in 2019. she is s/p bilateral mastectomy with TRAM flap reconstruction.  She was previously being followed by Dr. Jamal Collin who was checking her CA-125.  She had was for continued monitoring of her breast cancer. Currently patient feels well and denies any complaints at this time.  Appetite and weight have remained stable  ECOG PS- 0  Pain scale- 0   Review of systems- Review of Systems  Constitutional: Negative for chills, fever, malaise/fatigue and weight loss.  HENT: Negative for congestion, ear discharge and nosebleeds.   Eyes: Negative for blurred vision.  Respiratory: Negative for cough, hemoptysis, sputum production, shortness of breath and wheezing.   Cardiovascular: Negative for chest pain, palpitations, orthopnea and claudication.  Gastrointestinal: Negative for abdominal pain, blood in stool, constipation, diarrhea, heartburn, melena, nausea and vomiting.  Genitourinary: Negative for dysuria, flank pain, frequency,  hematuria and urgency.  Musculoskeletal: Negative for back pain, joint pain and myalgias.  Skin: Negative for rash.  Neurological: Negative for dizziness, tingling, focal weakness, seizures, weakness and headaches.  Endo/Heme/Allergies: Does not bruise/bleed easily.  Psychiatric/Behavioral: Negative for depression and suicidal ideas. The patient does not have insomnia.     Allergies  Allergen Reactions  . Enalapril     Cough   . Other Other (See Comments)    Medications ending in 'pril'; such as ace inhibitors    Patient Active Problem List   Diagnosis Date Noted  . Liver cyst 01/13/2020  . HTN (hypertension) 07/09/2019  . Diverticulitis 07/09/2019  . Hot flashes 09/02/2018  . History of breast cancer 09/01/2018  . Pain in left axilla 09/01/2018  . Neck pain 09/01/2018  . Chronic left shoulder pain 09/01/2018  . Bilateral breast cancer ([place]) 05/27/2014  . DCIS (ductal carcinoma in situ) right breast, ER/PR pos 09/04/2013  . Cancer ([place])   . Hyperlipidemia 12/31/2012  . Anxiety 12/31/2012  . Paroxysmal SVT (supraventricular tachycardia) ([place]) 12/31/2012  . Hypothyroidism 12/31/2012     Past Medical History:  Diagnosis Date  . BRCA negative 2016   MyRisk neg  . Breast cancer ([place])   . 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   . Cancer ([place]) 2006   pT2,N2a,Mx; ER/PR negative, Her 2 neu: 3+ ([facility]). Dermal lymphatic involvement, 9/ 22 nodes positive with extranodal extension.  Treated with Cytoxan, Adriamycin, Taxol and Herceptin.  . H/O cystitis   . Hemorrhoids   . Hot flashes   . Hx of migraines   . Hyperlipidemia   . Hypothyroid   . Osteopenia 11/2017   DEXA at [facility]  . Palpitations   .  Paroxysmal supraventricular tachycardia ([place])   . Personal history of tobacco use, presenting hazards to health   . Skin cancer    nmsc Dr. Kellie Moor   . Unspecified essential hypertension   . Unspecified hypothyroidism       Past Surgical History:  Procedure Laterality Date  . BREAST BIOPSY Left 2006   stereo breast bx  . BREAST RECONSTRUCTION Bilateral 06/08/14  . [place]   x 2  . COLONOSCOPY  2007   Dr. Allen Norris  . COLONOSCOPY WITH PROPOFOL N/A 10/22/2019   Procedure: COLONOSCOPY WITH PROPOFOL;  Surgeon: Robert Bellow, MD;  Location: [facility] ENDOSCOPY;  Service: Endoscopy;  Laterality: N/A;  . MASTECTOMY Left 2006   left breast  . MASTECTOMY  2015   right breast     Social History   Socioeconomic History  . Marital status: Married    Spouse name: Not on file  . Number of children: Not on file  . Years of education: Not on file  . Highest education level: Not on file  Occupational History  . Not on file  Tobacco Use  . Smoking status: Former Smoker    Packs/day: 1.00    Years: 10.00    Pack years: 10.00    Types: Cigarettes    Quit date: 09/18/1978    Years since quitting: 41.3  . Smokeless tobacco: Never Used  Vaping Use  . Vaping Use: Never used  Substance and Sexual Activity  . Alcohol use: Yes    Comment: bottle a wine weekly  . Drug use: No  . Sexual activity: Not Currently  Other Topics Concern  . 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 [employer]   Married    No guns, wears seat belt, safe in relationship    2 kids    Former smoker    Social Determinants of Radio broadcast assistant Strain:   . Difficulty of Paying Living Expenses:   Food Insecurity:   . Worried About Charity fundraiser in the Last Year:   . Arboriculturist in the Last Year:   Transportation Needs:   . Film/video editor (Medical):   Marland Kitchen Lack of Transportation (Non-Medical):   Physical Activity:   . Days of Exercise per Week:   . Minutes of Exercise per Session:   Stress:   . Feeling of Stress :   Social Connections:   . Frequency of Communication with Friends and Family:   . Frequency of Social Gatherings with Friends and Family:   .  Attends Religious Services:   . Active Member of Clubs or Organizations:   . Attends Archivist Meetings:   Marland Kitchen Marital Status:   Intimate Partner Violence:   . Fear of Current or Ex-Partner:   . Emotionally Abused:   Marland Kitchen Physically Abused:   . Sexually Abused:      Family History  Problem Relation Age of Onset  . Hypertension Mother   . Breast cancer Sister      Current Outpatient Medications:  .  citalopram (CELEXA) 20 MG tablet, Take 1 tablet (20 mg total) by mouth daily., Disp: 90 tablet, Rfl: 3 .  hydrochlorothiazide (HYDRODIURIL) 25 MG tablet, Take 1 tablet (25 mg total) by mouth daily. In am, Disp: 90 tablet, Rfl: 3 .  levothyroxine (SYNTHROID) 75 MCG tablet, TAKE ONE TABLET BY MOUTH ONCE DAILY, TAKE ON AN EMPTY STOMACH AND WAIT 30 minutes  before food, Disp: 90 tablet, Rfl: 3 .  olmesartan (BENICAR) 20 MG tablet, Take 1 tablet (20 mg total) by mouth daily. No allergy to medication can tolerate. appt further refills, Disp: 90 tablet, Rfl: 3 .  simvastatin (ZOCOR) 40 MG tablet, Take 1 tablet (40 mg total) by mouth daily at 6 PM., Disp: 90 tablet, Rfl: 3 .  Cholecalciferol (VITAMIN D PO), Take 1 tablet by mouth as needed. 2000 IU daily (Patient not taking: Reported on 01/29/2020), Disp: , Rfl:  .  [facility] CALCIUM PO, Take by mouth. (Patient not taking: Reported on 01/29/2020), Disp: , Rfl:  .  cyanocobalamin 2000 MCG tablet, Take 2,000 mcg by mouth daily. (Patient not taking: Reported on 01/29/2020), Disp: , Rfl:    Physical exam: Physical Exam Cardiovascular:     Rate and Rhythm: Normal rate and regular rhythm.     Heart sounds: Normal heart sounds.  Pulmonary:     Effort: Pulmonary effort is normal.     Breath sounds: Normal breath sounds.  Abdominal:     General: Bowel sounds are normal.     Palpations: Abdomen is soft.  Skin:    General: Skin is warm and dry.  Neurological:     Mental Status: She is alert and oriented to person, place, and time.     Breast:  Patient is s/p bilateral mastectomy with reconstruction.  No evidence of chest wall recurrence.  No palpable bilateral axillary adenopathy.   Assessment and plan- Patient is a 69 y.o. female with history of left breast cancer in 2006 and right breast DCIS in 2015.  She is s/p bilateral mastectomy and here to reestablish follow-up  I have explained to the patient that she is more than 5 years out since her diagnosis of DCIS and 16 years out since her diagnosis of her initial invasive left breast cancer.  She has undergone bilateral mastectomy and therefore does not require any screening mammograms.  There is no role for tumor marker testing in nonmetastatic breast cancer.  There is also no role for checking CA-125 patient not interested In ovarian cancer.  Patient does not have any history and therefore does not require any CA-125 to be checked in the future.  As such patient does not require any oncology follow-up and can continue to follow-up with Dr. Terese Door   Thank you for this kind referral and the opportunity to participate in the care of this patient   Visit Diagnosis 1. History of breast cancer     Dr. Randa Evens, MD, MPH [facility] [phone] 01/31/2020 8:47 AM

## 2020-02-06 ENCOUNTER — Telehealth: Payer: Self-pay | Admitting: Internal Medicine

## 2020-02-06 NOTE — Telephone Encounter (Signed)
Faxed request for a refill to [facility] for OLMESARTAN MEDOXOMIL 20MG  TAB

## 2020-04-26 DIAGNOSIS — Z23 Encounter for immunization: Secondary | ICD-10-CM | POA: Diagnosis not present

## 2020-05-27 IMAGING — DX DG CERVICAL SPINE COMPLETE 4+V
6 series · 6 of 6 positions shown · non-contrast
Comparison: None.

CLINICAL DATA: Neck pain radiating to the left shoulder and arm for
several weeks. No known injury.

EXAM:
CERVICAL SPINE - COMPLETE 4+ VIEW

[cervical spine ap]
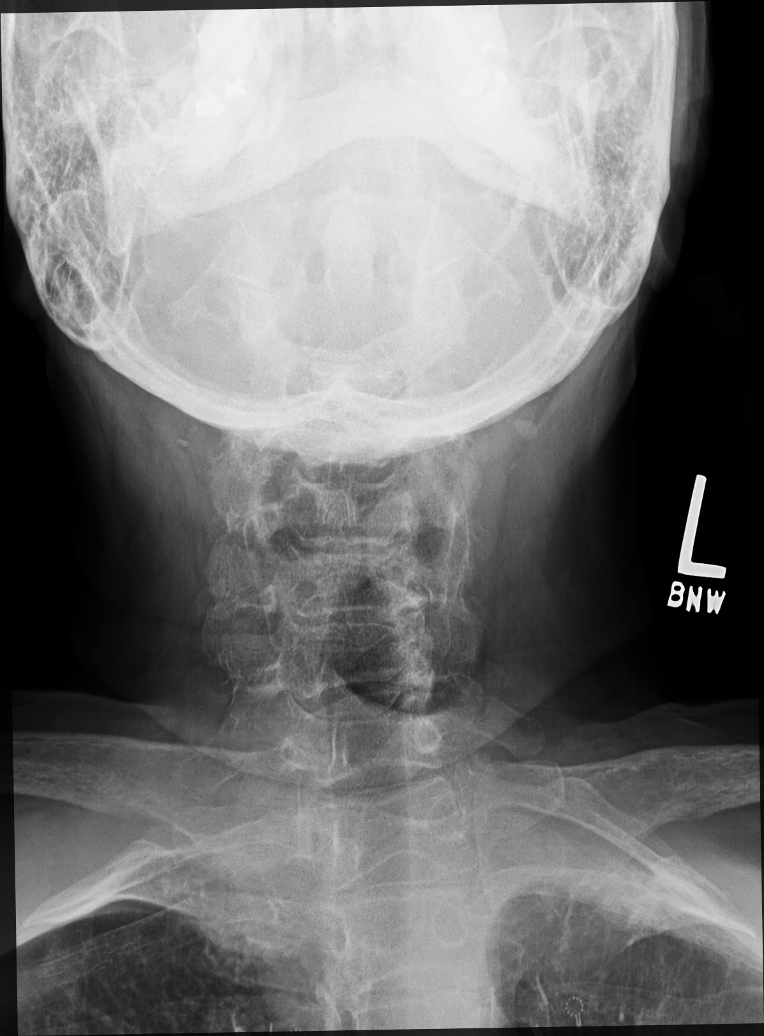

[cervical spine oblique (1 of 2)]
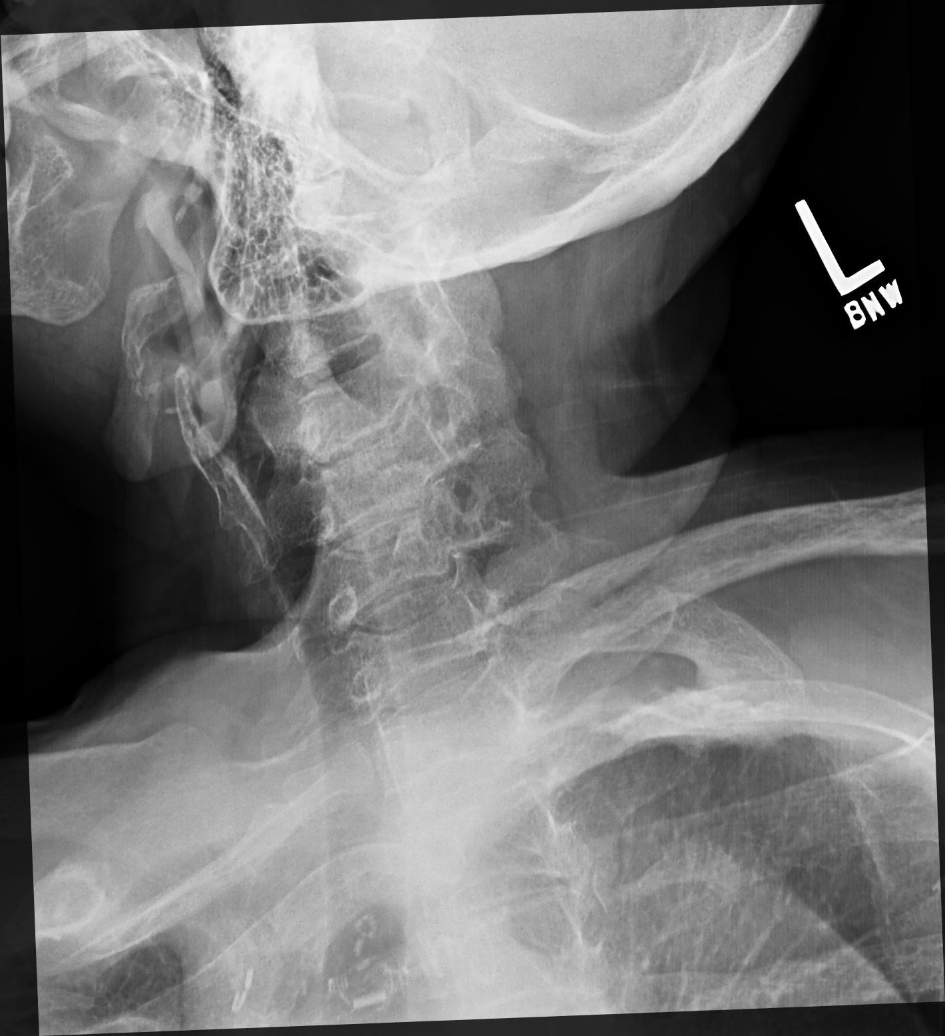

[cervical spine oblique (2 of 2)]
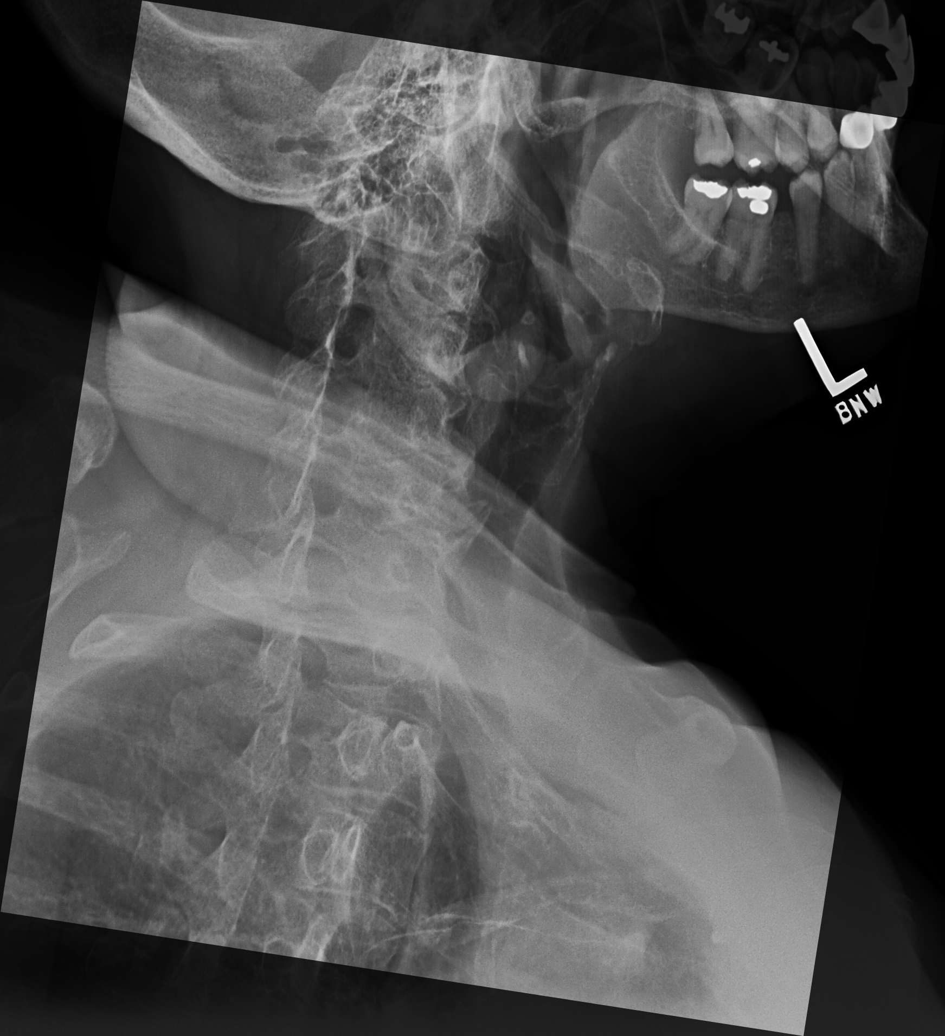

[cervical spine lat]
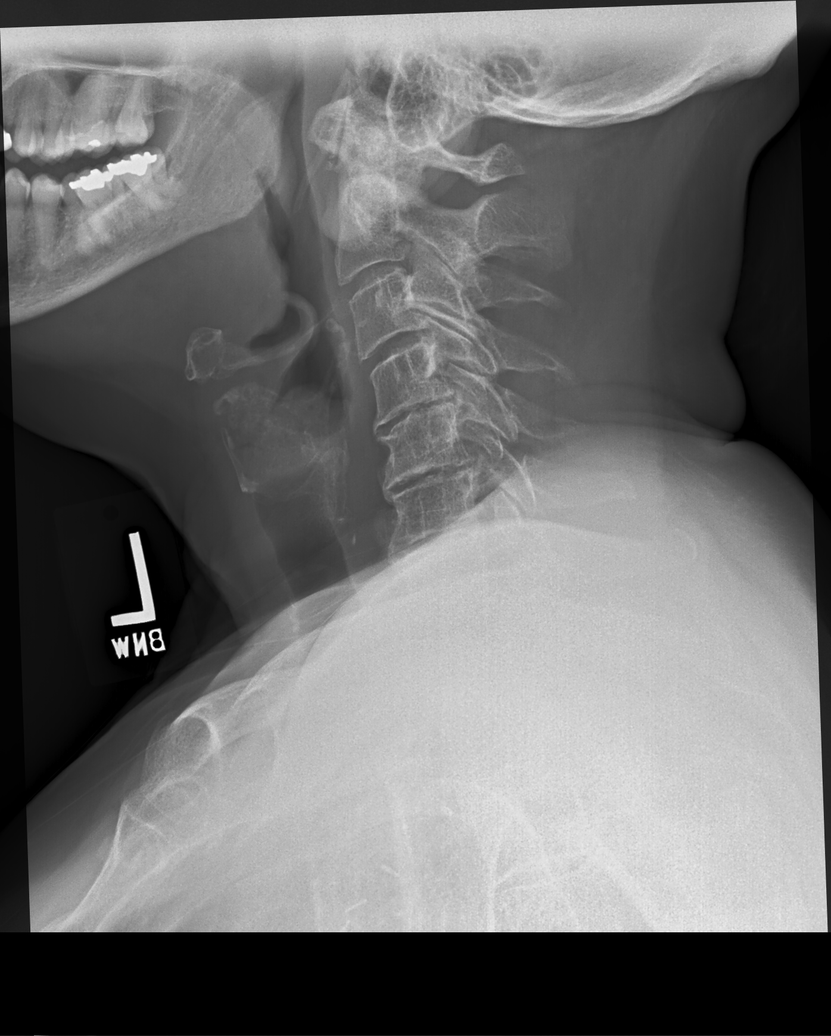

[swimmers lat]
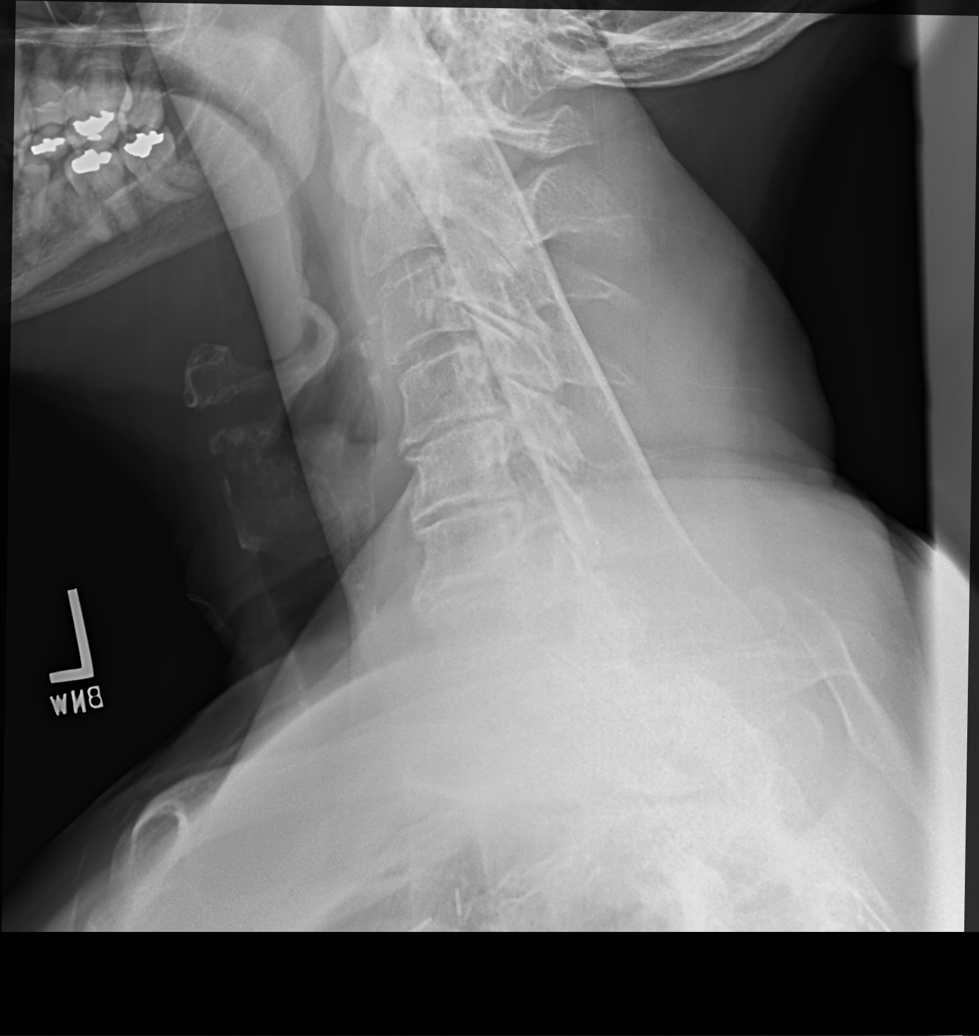

[cervical spine open mouth ap]
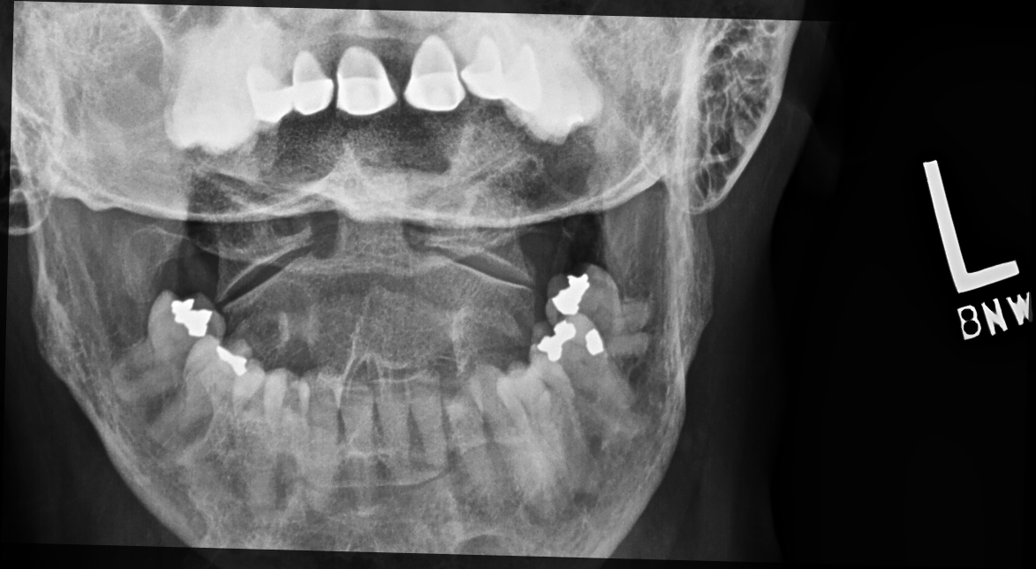

[6 of 6 positions shown; findings below may reference images not displayed]

FINDINGS: C1 to the superior endplate of C7 is imaged on the provided lateral
radiograph, however there is adequate visualization of the
cervicothoracic junction on the provided swimmer's radiograph.

There is straightening and slight reversal of the expected cervical
lordosis with mild kyphosis centered about the C5-C6 articulation.
No anterolisthesis or retrolisthesis. There is mild leftward
deviation the dens between the lateral masses of C1. Bilateral
facets appear normally aligned.

Cervical vertebral body heights appear preserved. Prevertebral soft
tissues appear normal

Mild to moderate multilevel cervical spine DDD, worse at C4-C5,
C5-C6 and C6-C7 with disc space height loss, endplate irregularity
and sclerosis.

The bilateral neural foramina appear patent given obliquity.

Atherosclerotic plaque overlies expected location the bilateral
carotid bulbs. Biapical pleuroparenchymal thickening, left slightly
greater than right.
IMPRESSION: 1. Straightening and slight reversal of the expected cervical
lordosis, nonspecific though could be seen in the setting of muscle
spasm. Otherwise, no acute findings.
2. Mild to moderate multilevel cervical spine DDD.

## 2020-06-08 DIAGNOSIS — Z0189 Encounter for other specified special examinations: Secondary | ICD-10-CM | POA: Diagnosis not present

## 2020-06-09 DIAGNOSIS — Z713 Dietary counseling and surveillance: Secondary | ICD-10-CM | POA: Diagnosis not present

## 2020-06-09 DIAGNOSIS — Z043 Encounter for examination and observation following other accident: Secondary | ICD-10-CM | POA: Diagnosis not present

## 2020-06-09 DIAGNOSIS — I10 Essential (primary) hypertension: Secondary | ICD-10-CM | POA: Diagnosis not present

## 2020-06-14 ENCOUNTER — Telehealth: Payer: Self-pay | Admitting: Internal Medicine

## 2020-06-14 DIAGNOSIS — E785 Hyperlipidemia, unspecified: Secondary | ICD-10-CM

## 2020-06-14 NOTE — Telephone Encounter (Signed)
[facility] labs 06/08/20   Kidney #s normal  Liver enzymes slightly elevated reasons could be liver cysts seen 06/24/19 or if drinking alcohol not worried about this ALT 38 slightly elevated   Cholesterol elevated total 207goal <200 and LDL 134 goal <100  -is she missing any doses of zocor?  -does she want to try increased intensity statin I.e lipitor  Thyroid labs normal   Blood cts normal   A1C 5.4 normal   Vitamin D 24.5 low  -rec D3 4000 IU daily otc

## 2020-06-14 NOTE — Telephone Encounter (Signed)
Left message for patient to return call to office. 

## 2020-06-18 NOTE — Telephone Encounter (Signed)
Left message to return call. Letter sent

## 2020-06-18 NOTE — Telephone Encounter (Signed)
Patient says consumes 6 to 8 glasses of wine per week and that she regularly taking her Zocor but is willing to try and increased statin. Patient also voiced understanding to Vit d3 4000 IUs daily.

## 2020-06-21 NOTE — Telephone Encounter (Signed)
I would reduce alcohol if able  Zocor max dose is 40 mg  Is she agreeable to change to lipitor 40?

## 2020-06-21 NOTE — Telephone Encounter (Signed)
Left message to return call 

## 2020-06-28 ENCOUNTER — Other Ambulatory Visit: Payer: Self-pay | Admitting: Internal Medicine

## 2020-06-28 DIAGNOSIS — E785 Hyperlipidemia, unspecified: Secondary | ICD-10-CM

## 2020-06-28 DIAGNOSIS — J309 Allergic rhinitis, unspecified: Secondary | ICD-10-CM | POA: Diagnosis not present

## 2020-06-28 MED ORDER — ATORVASTATIN CALCIUM 40 MG PO TABS: 40.0000 mg | ORAL_TABLET | Freq: Every day | ORAL | 3 refills | Status: DC

## 2020-06-28 NOTE — Addendum Note (Signed)
Addended by: Thressa Sheller on: 06/28/2020 01:02 PM   Modules accepted: Orders

## 2020-06-28 NOTE — Telephone Encounter (Signed)
Patient is agreeable to medication increase and consumption reduction

## 2020-06-28 NOTE — Telephone Encounter (Signed)
Ok stop zocor 40 mg and change to lipitor 40 mg at night   [facility] to [facility] [address]

## 2020-06-28 NOTE — Telephone Encounter (Signed)
Patient informed and verbalized understanding.  She asked med be re[facility]. Done

## 2020-07-06 ENCOUNTER — Encounter: Payer: Self-pay | Admitting: Internal Medicine

## 2020-07-09 ENCOUNTER — Other Ambulatory Visit: Payer: Self-pay | Admitting: Internal Medicine

## 2020-07-09 DIAGNOSIS — I10 Essential (primary) hypertension: Secondary | ICD-10-CM

## 2020-07-09 MED ORDER — OLMESARTAN MEDOXOMIL 20 MG PO TABS: 20.0000 mg | ORAL_TABLET | Freq: Every day | ORAL | 3 refills | Status: DC

## 2020-07-14 ENCOUNTER — Encounter: Payer: Self-pay | Admitting: Internal Medicine

## 2020-07-14 ENCOUNTER — Other Ambulatory Visit: Payer: Self-pay

## 2020-07-14 ENCOUNTER — Telehealth (INDEPENDENT_AMBULATORY_CARE_PROVIDER_SITE_OTHER): Payer: BC Managed Care – PPO | Admitting: Internal Medicine

## 2020-07-14 VITALS — Ht 66.0 in | Wt 181.0 lb

## 2020-07-14 DIAGNOSIS — Z853 Personal history of malignant neoplasm of breast: Secondary | ICD-10-CM

## 2020-07-14 DIAGNOSIS — M8580 Other specified disorders of bone density and structure, unspecified site: Secondary | ICD-10-CM

## 2020-07-14 DIAGNOSIS — I10 Essential (primary) hypertension: Secondary | ICD-10-CM | POA: Diagnosis not present

## 2020-07-14 DIAGNOSIS — R748 Abnormal levels of other serum enzymes: Secondary | ICD-10-CM

## 2020-07-14 DIAGNOSIS — Z1389 Encounter for screening for other disorder: Secondary | ICD-10-CM

## 2020-07-14 DIAGNOSIS — E039 Hypothyroidism, unspecified: Secondary | ICD-10-CM

## 2020-07-14 DIAGNOSIS — E785 Hyperlipidemia, unspecified: Secondary | ICD-10-CM

## 2020-07-14 NOTE — Patient Instructions (Addendum)
Consider moderna booster when you can   2 weeks to 1 month later consider prevnar  1 year after prevnar please get pneumonia 23 vaccine  -we can do pneumonia vaccines in the office   shingrix for shingles at your pharmacy and please get 2nd dose 2 months to no more than 6 months after the 1st dose   -space out different vaccine by 2-4 weeks   I will mail a copy of labs to do with work   Please monitor your blood pressure the goal is <130/<80 and let me know if not in the range and purchase a blood pressure cuff from [employer]?     Please call and schedule a bone density for [place] 11/12/20   Happy Holidays   Pneumococcal Vaccine, Polyvalent solution for injection What is this medicine? PNEUMOCOCCAL VACCINE, POLYVALENT (NEU mo KOK al vak SEEN, pol ee VEY luhnt) is a vaccine to prevent pneumococcus bacteria infection. These bacteria are a major cause of ear infections, Strep throat infections, and serious pneumonia, meningitis, or blood infections worldwide. These vaccines help the body to produce antibodies (protective substances) that help your body defend against these bacteria. This vaccine is recommended for people 45 years of age and older with health problems. It is also recommended for all adults over 40 years old. This vaccine will not treat an infection. This medicine may be used for other purposes; ask your health care provider or pharmacist if you have questions. COMMON BRAND NAME(S): Pneumovax 23 What should I tell my health care provider before I take this medicine? They need to know if you have any of these conditions:  bleeding problems  bone marrow or organ transplant  cancer, Hodgkin's disease  fever  infection  immune system problems  low platelet count in the blood  seizures  an unusual or allergic reaction to pneumococcal vaccine, diphtheria toxoid, other vaccines, latex, other medicines, foods, dyes, or preservatives  pregnant or trying to get  pregnant  breast-feeding How should I use this medicine? This vaccine is for injection into a muscle or under the skin. It is given by a health care professional. A copy of Vaccine Information Statements will be given before each vaccination. Read this sheet carefully each time. The sheet may change frequently. Talk to your pediatrician regarding the use of this medicine in children. While this drug may be prescribed for children as young as 53 years of age for selected conditions, precautions do apply. Overdosage: If you think you have taken too much of this medicine contact a poison control center or emergency room at once. NOTE: This medicine is only for you. Do not share this medicine with others. What if I miss a dose? It is important not to miss your dose. Call your doctor or health care professional if you are unable to keep an appointment. What may interact with this medicine?  medicines for cancer chemotherapy  medicines that suppress your immune function  medicines that treat or prevent blood clots like warfarin, enoxaparin, and dalteparin  steroid medicines like prednisone or cortisone This list may not describe all possible interactions. Give your health care provider a list of all the medicines, herbs, non-prescription drugs, or dietary supplements you use. Also tell them if you smoke, drink alcohol, or use illegal drugs. Some items may interact with your medicine. What should I watch for while using this medicine? Mild fever and pain should go away in 3 days or less. Report any unusual symptoms to your doctor or  health care professional. What side effects may I notice from receiving this medicine? Side effects that you should report to your doctor or health care professional as soon as possible:  allergic reactions like skin rash, itching or hives, swelling of the face, lips, or tongue  breathing problems  confused  fever over 102 degrees F  pain, tingling, numbness in  the hands or feet  seizures  unusual bleeding or bruising  unusual muscle weakness Side effects that usually do not require medical attention (report to your doctor or health care professional if they continue or are bothersome):  aches and pains  diarrhea  fever of 102 degrees F or less  headache  irritable  loss of appetite  pain, tender at site where injected  trouble sleeping This list may not describe all possible side effects. Call your doctor for medical advice about side effects. You may report side effects to FDA at 1-800-FDA-1088. Where should I keep my medicine? This does not apply. This vaccine is given in a clinic, pharmacy, doctor's office, or other health care setting and will not be stored at home. NOTE: This sheet is a summary. It may not cover all possible information. If you have questions about this medicine, talk to your doctor, pharmacist, or health care provider.  2020 Elsevier/Gold Standard (2008-02-28 14:32:37)  Pneumococcal Conjugate Vaccine suspension for injection What is this medicine? PNEUMOCOCCAL VACCINE (NEU mo KOK al vak SEEN) is a vaccine used to prevent pneumococcus bacterial infections. These bacteria can cause serious infections like pneumonia, meningitis, and blood infections. This vaccine will lower your chance of getting pneumonia. If you do get pneumonia, it can make your symptoms milder and your illness shorter. This vaccine will not treat an infection and will not cause infection. This vaccine is recommended for infants and young children, adults with certain medical conditions, and adults 49 years or older. This medicine may be used for other purposes; ask your health care provider or pharmacist if you have questions. COMMON BRAND NAME(S): Prevnar, Prevnar 13 What should I tell my health care provider before I take this medicine? They need to know if you have any of these conditions:  bleeding problems  fever  immune system  problems  an unusual or allergic reaction to pneumococcal vaccine, diphtheria toxoid, other vaccines, latex, other medicines, foods, dyes, or preservatives  pregnant or trying to get pregnant  breast-feeding How should I use this medicine? This vaccine is for injection into a muscle. It is given by a health care professional. A copy of Vaccine Information Statements will be given before each vaccination. Read this sheet carefully each time. The sheet may change frequently. Talk to your pediatrician regarding the use of this medicine in children. While this drug may be prescribed for children as young as 90 weeks old for selected conditions, precautions do apply. Overdosage: If you think you have taken too much of this medicine contact a poison control center or emergency room at once. NOTE: This medicine is only for you. Do not share this medicine with others. What if I miss a dose? It is important not to miss your dose. Call your doctor or health care professional if you are unable to keep an appointment. What may interact with this medicine?  medicines for cancer chemotherapy  medicines that suppress your immune function  steroid medicines like prednisone or cortisone This list may not describe all possible interactions. Give your health care provider a list of all the medicines, herbs, non-prescription drugs,  or dietary supplements you use. Also tell them if you smoke, drink alcohol, or use illegal drugs. Some items may interact with your medicine. What should I watch for while using this medicine? Mild fever and pain should go away in 3 days or less. Report any unusual symptoms to your doctor or health care professional. What side effects may I notice from receiving this medicine? Side effects that you should report to your doctor or health care professional as soon as possible:  allergic reactions like skin rash, itching or hives, swelling of the face, lips, or tongue  breathing  problems  confused  fast or irregular heartbeat  fever over 102 degrees F  seizures  unusual bleeding or bruising  unusual muscle weakness Side effects that usually do not require medical attention (report to your doctor or health care professional if they continue or are bothersome):  aches and pains  diarrhea  fever of 102 degrees F or less  headache  irritable  loss of appetite  pain, tender at site where injected  trouble sleeping This list may not describe all possible side effects. Call your doctor for medical advice about side effects. You may report side effects to FDA at 1-800-FDA-1088. Where should I keep my medicine? This does not apply. This vaccine is given in a clinic, pharmacy, doctor's office, or other health care setting and will not be stored at home. NOTE: This sheet is a summary. It may not cover all possible information. If you have questions about this medicine, talk to your doctor, pharmacist, or health care provider.  2020 Elsevier/Gold Standard (2014-04-30 10:27:27)  Zoster Vaccine, Recombinant injection What is this medicine? ZOSTER VACCINE (ZOS ter vak SEEN) is used to prevent shingles in adults 69 years old and over. This vaccine is not used to treat shingles or nerve pain from shingles. This medicine may be used for other purposes; ask your health care provider or pharmacist if you have questions. COMMON BRAND NAME(S): [facility] What should I tell my health care provider before I take this medicine? They need to know if you have any of these conditions:  blood disorders or disease  cancer like leukemia or lymphoma  immune system problems or therapy  an unusual or allergic reaction to vaccines, other medications, foods, dyes, or preservatives  pregnant or trying to get pregnant  breast-feeding How should I use this medicine? This vaccine is for injection in a muscle. It is given by a health care professional. Talk to your  pediatrician regarding the use of this medicine in children. This medicine is not approved for use in children. Overdosage: If you think you have taken too much of this medicine contact a poison control center or emergency room at once. NOTE: This medicine is only for you. Do not share this medicine with others. What if I miss a dose? Keep appointments for follow-up (booster) doses as directed. It is important not to miss your dose. Call your doctor or health care professional if you are unable to keep an appointment. What may interact with this medicine?  medicines that suppress your immune system  medicines to treat cancer  steroid medicines like prednisone or cortisone This list may not describe all possible interactions. Give your health care provider a list of all the medicines, herbs, non-prescription drugs, or dietary supplements you use. Also tell them if you smoke, drink alcohol, or use illegal drugs. Some items may interact with your medicine. What should I watch for while using this medicine?  Visit your doctor for regular check ups. This vaccine, like all vaccines, may not fully protect everyone. What side effects may I notice from receiving this medicine? Side effects that you should report to your doctor or health care professional as soon as possible:  allergic reactions like skin rash, itching or hives, swelling of the face, lips, or tongue  breathing problems Side effects that usually do not require medical attention (report these to your doctor or health care professional if they continue or are bothersome):  chills  headache  fever  nausea, vomiting  redness, warmth, pain, swelling or itching at site where injected  tiredness This list may not describe all possible side effects. Call your doctor for medical advice about side effects. You may report side effects to FDA at 1-800-FDA-1088. Where should I keep my medicine? This vaccine is only given in a clinic,  pharmacy, doctor's office, or other health care setting and will not be stored at home. NOTE: This sheet is a summary. It may not cover all possible information. If you have questions about this medicine, talk to your doctor, pharmacist, or health care provider.  2020 Elsevier/Gold Standard (2017-03-05 13:20:30)

## 2020-07-14 NOTE — Progress Notes (Addendum)
Virtual Visit via Video Note  I connected with Adriana Cobb  on 07/14/20 at  4:30 PM EST by a video enabled telemedicine application and verified that I am speaking with the correct person using two identifiers.  Location patient: work [employer] or home office Persons participating in the virtual visit: patient, provider  I discussed the limitations of evaluation and management by telemedicine and the availability of in person appointments. The patient expressed understanding and agreed to proceed.   HPI: Follow up  1. HTN not checked BP today/HLD on lipitor 40 mg qd and hctz 25 mg qd and benicar 20 mg qd  2. Hypothyroidism on 75 mcg levo 3. Mood stable on celexa 20 mg qd doing well  4. H/o breast cancer s/p b/l mastectomy saw Dr. Janese Banks (seen 2021) no need for f/u or maker check per h/o and f/u with PCP  ROS: See pertinent positives and negatives per HPI.  Past Medical History:  Diagnosis Date  . BRCA negative 2016   MyRisk neg  . Breast cancer ([place])   . 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   . Cancer ([place]) 2006   pT2,N2a,Mx; ER/PR negative, Her 2 neu: 3+ ([facility]). Dermal lymphatic involvement, 9/ 22 nodes positive with extranodal extension.  Treated with Cytoxan, Adriamycin, Taxol and Herceptin.  . H/O cystitis   . Hemorrhoids   . Hot flashes   . Hx of migraines   . Hyperlipidemia   . Hypothyroid   . Osteopenia 11/2017   [facility] at [facility]  . Palpitations   . Paroxysmal supraventricular tachycardia ([place])   . Personal history of tobacco use, presenting hazards to health   . Skin cancer    nmsc Dr. Kellie Moor   . Unspecified essential hypertension   . Unspecified hypothyroidism     Past Surgical History:  Procedure Laterality Date  . BREAST BIOPSY Left 2006   stereo breast bx  . BREAST RECONSTRUCTION Bilateral 06/08/14  . [place]   x 2  . COLONOSCOPY  2007   Dr. Allen Norris  . COLONOSCOPY WITH  PROPOFOL N/A 10/22/2019   Procedure: COLONOSCOPY WITH PROPOFOL;  Surgeon: Robert Bellow, MD;  Location: [facility] ENDOSCOPY;  Service: Endoscopy;  Laterality: N/A;  . MASTECTOMY Left 2006   left breast  . MASTECTOMY  2015   right breast      Current Outpatient Medications:  .  atorvastatin (LIPITOR) 40 MG tablet, Take 1 tablet (40 mg total) by mouth daily. At night d/c zocor 40 mg, Disp: 90 tablet, Rfl: 3 .  Cholecalciferol (VITAMIN D PO), Take 2 tablets by mouth as needed. 2000 IU daily, Disp: , Rfl:  .  citalopram (CELEXA) 20 MG tablet, Take 1 tablet (20 mg total) by mouth daily., Disp: 90 tablet, Rfl: 3 .  [facility] CALCIUM PO, Take by mouth. , Disp: , Rfl:  .  cyanocobalamin 2000 MCG tablet, Take 2,000 mcg by mouth daily. , Disp: , Rfl:  .  hydrochlorothiazide (HYDRODIURIL) 25 MG tablet, Take 1 tablet (25 mg total) by mouth daily. In am, Disp: 90 tablet, Rfl: 3 .  levothyroxine (SYNTHROID) 75 MCG tablet, TAKE ONE TABLET BY MOUTH ONCE DAILY, TAKE ON AN EMPTY STOMACH AND WAIT 30 minutes before food, Disp: 90 tablet, Rfl: 3 .  olmesartan (BENICAR) 20 MG tablet, Take 1 tablet (20 mg total) by mouth daily. No allergy to medication can tolerate. appt further refills, Disp: 90 tablet, Rfl:  3  EXAM:  VITALS per patient if applicable:  GENERAL: alert, oriented, appears well and in no acute distress  HEENT: atraumatic, conjunttiva clear, no obvious abnormalities on inspection of external nose and ears  NECK: normal movements of the head and neck  LUNGS: on inspection no signs of respiratory distress, breathing rate appears normal, no obvious gross SOB, gasping or wheezing  CV: no obvious cyanosis  MS: moves all visible extremities without noticeable abnormality  PSYCH/NEURO: pleasant and cooperative, no obvious depression or anxiety, speech and thought processing grossly intact  ASSESSMENT AND PLAN:  Discussed the following assessment and plan:  Primary hypertension/HLD On hctz 25 mg qd  and benicar 20 mg qd  On lipitor 40 mg qd  Monitor BP buy cuff [employer]   Osteopenia, unspecified location - Plan: DG Bone Density  Hypothyroidism, unspecified type On levo 75 mcg qd  Orders [facility] labs at work   History of breast cancer s/p b/l mastectomy  2021 saw Dr. Janese Banks no need mammograms, marker check f/u with PCP   [facility] 01/2020 with [facility] [place], will mail [facility] orders for work again   Flu shot utd  moderna 2/2 consider booster  Given Rx tdap and shingrix per pt [facility] [address]  Consider prevnar and pna 23 vaccines in the future  Hep B 3/3 03/19/19, 10/09/18, 09/11/18  Zoster vax 09/28/11   Consider Tdap (may have had [place]) check the date had 10/22/15, 01/16/20 MMR immune  rec hep B and neg hep C  TSH nl 08/2018  Labs 06/24/2019 [facility] 1 of liver enzymes slightly elevated ALT 36  Cholesterol normal  Thyroid labs normal  Blood cts normal  A1c 5.3 no prediabetes   CA 125 cancer marker pendingneed to call [facility] never sent copy of final report CA 27.29 normal   Vitamin D 33 rec D3 2000 IU daily   Reviewed labs 06/12/18 CMET nl phos 4.6 sl high 4.5 normal, tc 164, tg 104, HDL 51, LDL 92, TSH 4.970 (elevated nl 4.50, CBC 4.3/13.6/39.2/242, CA 125 6.1, vitamin D 39.1, CA 27.29 11.3   Urine normal 09/02/2018 TSH nl 1.710  S/p b/l mastectomyfor breast cancerno need further mammo, cancer markers per Dr. Janese Banks 2021  dexa 11/12/17 osteopenia on vitamin D3 2000 IU daily  -repeat dexa 11/2020   Colonoscopy 02/11/14 normal repeat in 10 yearsbut CT 06/24/2019 with acute diverticulitis colonosocpy 10/22/19 Dr. Bary Castilla f/u in 10 years   Pap [facility] 09/11/17 negative neg HPV  Dermatology 2021  [facility] f/u 1 year   Eye MD [facility]  D[facility]  Surgery Dr. Bary Castilla    -we discussed possible serious and likely etiologies, options for evaluation and workup, limitations of telemedicine visit vs in person visit,  treatment, treatment risks and precautions.   I discussed the assessment and treatment plan with the patient. The patient was provided an opportunity to ask questions and all were answered. The patient agreed with the plan and demonstrated an understanding of the instructions.    Time spent 20 minutes Delorise Jackson, MD

## 2020-07-20 ENCOUNTER — Telehealth: Payer: Self-pay

## 2020-07-20 DIAGNOSIS — R748 Abnormal levels of other serum enzymes: Secondary | ICD-10-CM

## 2020-07-20 DIAGNOSIS — M8580 Other specified disorders of bone density and structure, unspecified site: Secondary | ICD-10-CM | POA: Insufficient documentation

## 2020-07-20 HISTORY — DX: Abnormal levels of other serum enzymes: R74.8

## 2020-07-20 NOTE — Telephone Encounter (Signed)
LVM for pt to schedule 4-6 months with PCP

## 2020-07-28 NOTE — Telephone Encounter (Signed)
consent

## 2020-08-02 ENCOUNTER — Telehealth: Payer: Self-pay | Admitting: Internal Medicine

## 2020-08-02 NOTE — Telephone Encounter (Signed)
Shots are not recorded in [facility]. Called [facility] and they state they do not have these shots on file for the patient either.  Will send Patient a mychart message asking for clarification.

## 2020-08-02 NOTE — Telephone Encounter (Signed)
-----   Message from Delorise Jackson, MD sent at 07/20/2020  5:54 AM EST ----- [facility] date of [facility]rix [facility] [address]  Any Tdap in [place]?

## 2020-08-12 ENCOUNTER — Other Ambulatory Visit: Payer: Self-pay | Admitting: Internal Medicine

## 2020-08-12 DIAGNOSIS — E039 Hypothyroidism, unspecified: Secondary | ICD-10-CM

## 2020-08-12 MED ORDER — LEVOTHYROXINE SODIUM 75 MCG PO TABS: ORAL_TABLET | ORAL | 3 refills | Status: DC

## 2020-08-12 NOTE — Telephone Encounter (Signed)
Patient has not had her shingles shots yet.   She will be getting her booster soon. Informed Patient to wait 14 days after booster to get her shingles shots started. Patient verbalized understanding  She will have her health at work mail her shot records to our office.

## 2020-08-12 NOTE — Telephone Encounter (Signed)
Pt returned your call-please call today if possible

## 2020-10-06 ENCOUNTER — Other Ambulatory Visit: Payer: Self-pay | Admitting: Internal Medicine

## 2020-10-06 DIAGNOSIS — F419 Anxiety disorder, unspecified: Secondary | ICD-10-CM

## 2020-10-06 MED ORDER — CITALOPRAM HYDROBROMIDE 20 MG PO TABS: 20.0000 mg | ORAL_TABLET | Freq: Every day | ORAL | 3 refills | Status: DC

## 2020-11-08 NOTE — Progress Notes (Signed)
Cardiology Office Note  Date:  11/09/2020   ID:  Adriana Cobb, DOB 1951/07/07, MRN [id number]  PCP:  McLean-Scocuzza, Nino Glow, MD   Chief Complaint  Patient presents with  . 12 month follow up     "doing well." Medications reviewed by the patient verbally.     HPI:  Adriana Cobb is a very pleasant 70 year old woman with history of  breast cancer on left, mastectomy/chemotherapy/radiation treatments 2006,  SVT with 2 episodes in 2008 treated with adenosine, initially started on amiodarone and changed to sotalol.  Hyperlipidemia Former smoker She presents for routine followup of her SVT and hyperlipidemia  LOV with myself 11/2016 Telemedicine visit with one of our providers 08/2019  administrative assistant at [place]  In 2021, sotalol slowly down titrated and then discontinued.   She has had no recurrent SVT  CT scan abdomen pelvis images pulled up, mild diffuse aortic atherosclerosis reviewed nonimages  Cholesterol running higher, 180 up to 200 Lipitor recently increased  EKG personally reviewed by myself on todays visit Shows normal sinus rhythm with rate 79 bpm no significant ST or T wave changes   Other past medical history reviewed  History of breast reconstruction surgery at [facility] Flap procedure, 3 stages of surgery Prior mastectomy on the right for recurrent cancer.    SVT episodes in 2008.   Studies done at [facility] Echocardiogram in 2008 was essentially normal with mild MR Echocardiogram May 2009 documented moderate pulmonic regurgitation and moderate MR Echocardiogram November 2009 showed mild to moderate MR Stress test June 2000 and shows no ischemia, she exercised on treadmill for 8 minutes 30 seconds achieved 10 METS   PMH:   has a past medical history of BRCA negative (2016), Breast cancer ([place]), Breast neoplasm, Tis (DCIS), right (08/11/2017), Cancer ([place]) (2006), H/O cystitis, Hemorrhoids, Hot flashes, migraines, Hyperlipidemia,  Hypothyroid, Osteopenia (11/2017), Palpitations, Paroxysmal supraventricular tachycardia ([place]), Personal history of tobacco use, presenting hazards to health, Skin cancer, Unspecified essential hypertension, and Unspecified hypothyroidism.  PSH:    Past Surgical History:  Procedure Laterality Date  . BREAST BIOPSY Left 2006   stereo breast bx  . BREAST RECONSTRUCTION Bilateral 06/08/14  . [place]   x 2  . COLONOSCOPY  2007   Dr. Allen Norris  . COLONOSCOPY WITH PROPOFOL N/A 10/22/2019   Procedure: COLONOSCOPY WITH PROPOFOL;  Surgeon: Robert Bellow, MD;  Location: [facility] ENDOSCOPY;  Service: Endoscopy;  Laterality: N/A;  . MASTECTOMY Left 2006   left breast  . MASTECTOMY  2015   right breast     Current Outpatient Medications  Medication Sig Dispense Refill  . aspirin EC 81 MG tablet Take 1 tablet (81 mg total) by mouth daily. Swallow whole. 90 tablet 3  . atorvastatin (LIPITOR) 40 MG tablet Take 1 tablet (40 mg total) by mouth daily. At night d/c zocor 40 mg 90 tablet 3  . Cholecalciferol (VITAMIN D PO) Take 2 tablets by mouth as needed. 2000 IU daily    . citalopram (CELEXA) 20 MG tablet Take 1 tablet (20 mg total) by mouth daily. 90 tablet 3  . [facility]S CALCIUM PO Take by mouth.     . cyanocobalamin 2000 MCG tablet Take 2,000 mcg by mouth daily.     Marland Kitchen ezetimibe (ZETIA) 10 MG tablet Take 1 tablet (10 mg total) by mouth daily. 90 tablet 4  . hydrochlorothiazide (HYDRODIURIL) 25 MG tablet Take 1 tablet (25 mg total) by mouth daily. In am 90 tablet 3  .  levothyroxine (SYNTHROID) 75 MCG tablet TAKE ONE TABLET BY MOUTH ONCE DAILY, TAKE ON AN EMPTY STOMACH AND WAIT 30 minutes before food 90 tablet 3  . olmesartan (BENICAR) 20 MG tablet Take 1 tablet (20 mg total) by mouth daily. No allergy to medication can tolerate. appt further refills 90 tablet 3   No current facility-administered medications for this visit.     Allergies:   Enalapril and Other   Social History:  The  patient  reports that she quit smoking about 42 years ago. Her smoking use included cigarettes. She has a 10.00 pack-year smoking history. She has never used smokeless tobacco. She reports current alcohol use. She reports that she does not use drugs.   Family History:   family history includes Breast cancer in her sister; Hypertension in her mother.    Review of Systems: Review of Systems  Constitutional: Negative.   HENT: Negative.   Respiratory: Negative.   Cardiovascular: Negative.   Gastrointestinal: Negative.   Musculoskeletal: Negative.   Neurological: Negative.   Psychiatric/Behavioral: Negative.   All other systems reviewed and are negative.   PHYSICAL EXAM: VS:  BP 120/62 (BP Location: Left Arm, Patient Position: Sitting, Cuff Size: Normal)   Pulse 79   Ht '5\' 6"'  (1.676 m)   Wt 183 lb 2 oz (83.1 kg)   SpO2 98%   BMI 29.56 kg/m  , BMI Body mass index is 29.56 kg/m. GEN: Well nourished, well developed, in no acute distress  HEENT: normal  Neck: no JVD, carotid bruits, or masses Cardiac: RRR; no murmurs, rubs, or gallops,no edema  Respiratory:  clear to auscultation bilaterally, normal work of breathing GI: soft, nontender, nondistended, + BS MS: no deformity or atrophy  Skin: warm and dry, no rash Neuro:  Strength and sensation are intact Psych: euthymic mood, full affect   Recent Labs: No results found for requested labs within last 8760 hours.    Lipid Panel No results found for: CHOL, HDL, LDLCALC, TRIG    Wt Readings from Last 3 Encounters:  11/09/20 183 lb 2 oz (83.1 kg)  07/14/20 181 lb (82.1 kg)  01/13/20 181 lb 6.4 oz (82.3 kg)      ASSESSMENT AND PLAN:  Mixed hyperlipidemia On Lipitor, we will add Zetia to achieve goal LDL less than 70 Significant aortic atherosclerosis on CT scan, reviewed with her  Paroxysmal SVT (supraventricular tachycardia) (HCC) Off sotalol No recurrent episodes, discussed carotid sinus massage, Valsalva for any  recurrent symptoms  Cancer (HCC) Prior history of breast cancer, mastectomy  Hypothyroidism, unspecified type Managed by OB/GYN and nurse at work  Aortic atherosclerosis Images reviewed, recommended LDL goal less than 70 On Lipitor, Zetia added Reports that she has repeat lab work in June 2022   Total encounter time more than 25 minutes  Greater than 50% was spent in counseling and coordination of care with the patient   Disposition:   F/U  12 months   Orders Placed This Encounter  Procedures  . EKG 12-Lead     Signed, Esmond Plants, M.D., Ph.D. 11/09/2020  [place], [place]

## 2020-11-09 ENCOUNTER — Ambulatory Visit: Payer: BC Managed Care – PPO | Admitting: Cardiovascular Disease

## 2020-11-09 ENCOUNTER — Encounter: Payer: Self-pay | Admitting: Cardiovascular Disease

## 2020-11-09 ENCOUNTER — Other Ambulatory Visit: Payer: Self-pay

## 2020-11-09 VITALS — BP 120/62 | HR 79 | Ht 66.0 in | Wt 183.1 lb

## 2020-11-09 DIAGNOSIS — I471 Supraventricular tachycardia: Secondary | ICD-10-CM | POA: Diagnosis not present

## 2020-11-09 DIAGNOSIS — I10 Essential (primary) hypertension: Secondary | ICD-10-CM | POA: Diagnosis not present

## 2020-11-09 DIAGNOSIS — I700 Atherosclerosis of aorta: Secondary | ICD-10-CM | POA: Diagnosis not present

## 2020-11-09 DIAGNOSIS — E782 Mixed hyperlipidemia: Secondary | ICD-10-CM

## 2020-11-09 MED ORDER — EZETIMIBE 10 MG PO TABS: 10.0000 mg | ORAL_TABLET | Freq: Every day | ORAL | 4 refills | Status: DC

## 2020-11-09 MED ORDER — ASPIRIN EC 81 MG PO TBEC: 81.0000 mg | DELAYED_RELEASE_TABLET | Freq: Every day | ORAL | 3 refills | Status: DC

## 2020-11-09 NOTE — Patient Instructions (Signed)
Medication Instructions:  Diona Fanti 81 daily  Add zetia 10 mg daily  If you need a refill on your cardiac medications before your next appointment, please call your pharmacy.    Lab work: No new labs needed   If you have labs (blood work) drawn today and your tests are completely normal, you will receive your results only by: Marland Kitchen MyChart Message (if you have MyChart) OR . A paper copy in the mail If you have any lab test that is abnormal or we need to change your treatment, we will call you to review the results.   Testing/Procedures: No new testing needed   Follow-Up: At [facility], you and your health needs are our priority.  As part of our continuing mission to provide you with exceptional heart care, we have created designated Provider Care Teams.  These Care Teams include your primary Cardiologist (physician) and Advanced Practice Providers (APPs -  Physician Assistants and Nurse Practitioners) who all work together to provide you with the care you need, when you need it.  . You will need a follow up appointment in 12 months  . Providers on your designated Care Team:   . Murray Hodgkins, NP . Christell Faith, PA-C . Marrianne Mood, PA-C  Any Other Special Instructions Will Be Listed Below (If Applicable).  COVID-19 Vaccine Information can be found at: ShippingScam.co.uk For questions related to vaccine distribution or appointments, please email vaccine@ [email or web address] or call 7040160439.

## 2020-12-13 DIAGNOSIS — Z20822 Contact with and (suspected) exposure to covid-19: Secondary | ICD-10-CM | POA: Diagnosis not present

## 2020-12-31 ENCOUNTER — Other Ambulatory Visit: Payer: Self-pay | Admitting: Internal Medicine

## 2020-12-31 DIAGNOSIS — I10 Essential (primary) hypertension: Secondary | ICD-10-CM

## 2021-03-25 DIAGNOSIS — Z85828 Personal history of other malignant neoplasm of skin: Secondary | ICD-10-CM | POA: Diagnosis not present

## 2021-03-25 DIAGNOSIS — L538 Other specified erythematous conditions: Secondary | ICD-10-CM | POA: Diagnosis not present

## 2021-03-25 DIAGNOSIS — L298 Other pruritus: Secondary | ICD-10-CM | POA: Diagnosis not present

## 2021-03-25 DIAGNOSIS — L820 Inflamed seborrheic keratosis: Secondary | ICD-10-CM | POA: Diagnosis not present

## 2021-03-25 DIAGNOSIS — D2271 Melanocytic nevi of right lower limb, including hip: Secondary | ICD-10-CM | POA: Diagnosis not present

## 2021-03-25 DIAGNOSIS — D2261 Melanocytic nevi of right upper limb, including shoulder: Secondary | ICD-10-CM | POA: Diagnosis not present

## 2021-03-25 DIAGNOSIS — D2262 Melanocytic nevi of left upper limb, including shoulder: Secondary | ICD-10-CM | POA: Diagnosis not present

## 2021-06-21 ENCOUNTER — Other Ambulatory Visit: Payer: Self-pay | Admitting: Internal Medicine

## 2021-06-21 DIAGNOSIS — I10 Essential (primary) hypertension: Secondary | ICD-10-CM

## 2021-08-08 ENCOUNTER — Other Ambulatory Visit: Payer: Self-pay | Admitting: Internal Medicine

## 2021-08-08 DIAGNOSIS — E039 Hypothyroidism, unspecified: Secondary | ICD-10-CM

## 2021-08-09 NOTE — Telephone Encounter (Signed)
LMTCB. Need to schedule pt for an office visit, not been since 07/2020.

## 2021-08-11 ENCOUNTER — Other Ambulatory Visit: Payer: Self-pay | Admitting: Internal Medicine

## 2021-08-11 DIAGNOSIS — E785 Hyperlipidemia, unspecified: Secondary | ICD-10-CM

## 2021-09-15 ENCOUNTER — Other Ambulatory Visit: Payer: Self-pay

## 2021-09-15 ENCOUNTER — Encounter: Payer: Self-pay | Admitting: Internal Medicine

## 2021-09-15 ENCOUNTER — Ambulatory Visit (INDEPENDENT_AMBULATORY_CARE_PROVIDER_SITE_OTHER): Payer: Medicare HMO | Admitting: Internal Medicine

## 2021-09-15 VITALS — BP 130/70 | HR 78 | Temp 98.7°F | Ht 66.0 in | Wt 181.2 lb

## 2021-09-15 DIAGNOSIS — M8580 Other specified disorders of bone density and structure, unspecified site: Secondary | ICD-10-CM

## 2021-09-15 DIAGNOSIS — E039 Hypothyroidism, unspecified: Secondary | ICD-10-CM | POA: Diagnosis not present

## 2021-09-15 DIAGNOSIS — E785 Hyperlipidemia, unspecified: Secondary | ICD-10-CM

## 2021-09-15 DIAGNOSIS — F419 Anxiety disorder, unspecified: Secondary | ICD-10-CM | POA: Diagnosis not present

## 2021-09-15 DIAGNOSIS — B351 Tinea unguium: Secondary | ICD-10-CM | POA: Diagnosis not present

## 2021-09-15 DIAGNOSIS — I10 Essential (primary) hypertension: Secondary | ICD-10-CM | POA: Diagnosis not present

## 2021-09-15 DIAGNOSIS — Z23 Encounter for immunization: Secondary | ICD-10-CM

## 2021-09-15 DIAGNOSIS — Z0000 Encounter for general adult medical examination without abnormal findings: Secondary | ICD-10-CM

## 2021-09-15 DIAGNOSIS — E2839 Other primary ovarian failure: Secondary | ICD-10-CM

## 2021-09-15 MED ORDER — HYDROCHLOROTHIAZIDE 25 MG PO TABS: 25.0000 mg | ORAL_TABLET | Freq: Every morning | ORAL | 3 refills | Status: DC

## 2021-09-15 MED ORDER — ATORVASTATIN CALCIUM 40 MG PO TABS: 40.0000 mg | ORAL_TABLET | Freq: Every day | ORAL | 3 refills | Status: DC

## 2021-09-15 MED ORDER — EZETIMIBE 10 MG PO TABS: 10.0000 mg | ORAL_TABLET | Freq: Every day | ORAL | 4 refills | Status: DC

## 2021-09-15 MED ORDER — LEVOTHYROXINE SODIUM 75 MCG PO TABS: ORAL_TABLET | ORAL | 3 refills | Status: DC

## 2021-09-15 MED ORDER — ASPIRIN EC 81 MG PO TBEC: 81.0000 mg | DELAYED_RELEASE_TABLET | Freq: Every day | ORAL | 3 refills | Status: DC

## 2021-09-15 MED ORDER — OLMESARTAN MEDOXOMIL 20 MG PO TABS: ORAL_TABLET | ORAL | 3 refills | Status: DC

## 2021-09-15 MED ORDER — SHINGRIX 50 MCG/0.5ML IM SUSR: 0.5000 mL | Freq: Once | INTRAMUSCULAR | 1 refills | Status: AC

## 2021-09-15 MED ORDER — DR GS CLEAR NAIL 1 % EX SOLN: 1.0000 "application " | Freq: Two times a day (BID) | CUTANEOUS | 11 refills | Status: DC

## 2021-09-15 MED ORDER — CITALOPRAM HYDROBROMIDE 20 MG PO TABS: 20.0000 mg | ORAL_TABLET | Freq: Every day | ORAL | 3 refills | Status: DC

## 2021-09-15 NOTE — Patient Instructions (Signed)

## 2021-09-15 NOTE — Progress Notes (Signed)
Chief Complaint  °Patient presents with  ° Medicare Wellness  ° Annual Exam  ° °Annual  °1. Htn controlled on hctz 25 mg qd and benicar 20 mg qd °2. Mixed hld on zetia 10 and lipitor 40 mg qhs  ° ° °Review of Systems  °Constitutional:  Negative for weight loss.  °HENT:  Negative for hearing loss.   °Eyes:  Negative for blurred vision.  °Respiratory:  Negative for shortness of breath.   °Cardiovascular:  Negative for chest pain.  °Gastrointestinal:  Negative for abdominal pain and blood in stool.  °Genitourinary:  Negative for dysuria.  °Musculoskeletal:  Negative for falls and joint pain.  °Skin:  Negative for rash.  °Neurological:  Negative for headaches.  °Psychiatric/Behavioral:  Negative for depression.   °Past Medical History:  °Diagnosis Date  ° BRCA negative 2016  ° MyRisk neg  ° Breast cancer (HCC)   ° Breast neoplasm, Tis (DCIS), right 08/11/2017  ° Intermediate grade DCIS; ER: 90%; PR: 30 %.   ° Cancer (HCC) 2006  ° pT2,N2a,Mx; ER/PR negative, Her 2 neu: 3+ ([facility]). Dermal lymphatic involvement, 9/ 22 nodes positive with extranodal extension.  Treated with Cytoxan, Adriamycin, Taxol and Herceptin.  ° H/O cystitis   ° Hemorrhoids   ° Hot flashes   ° Hx of migraines   ° Hyperlipidemia   ° Hypothyroid   ° Osteopenia 11/2017  ° DEXA at [facility]  ° Palpitations   ° Paroxysmal supraventricular tachycardia (HCC)   ° Personal history of tobacco use, presenting hazards to health   ° Skin cancer   ° nmsc Dr. Isenstein   ° Unspecified essential hypertension   ° Unspecified hypothyroidism   ° °Past Surgical History:  °Procedure Laterality Date  ° BREAST BIOPSY Left 2006  ° stereo breast bx  ° BREAST RECONSTRUCTION Bilateral 06/08/14  ° CESAREAN SECTION  1987, 1988  ° x 2  ° COLONOSCOPY  2007  ° Dr. Wohl  ° COLONOSCOPY WITH PROPOFOL N/A 10/22/2019  ° Procedure: COLONOSCOPY WITH PROPOFOL;  Surgeon: Byrnett, Jeffrey W, MD;  Location: [facility] ENDOSCOPY;  Service: Endoscopy;  Laterality: N/A;  ° MASTECTOMY Left 2006  ° left breast  °  MASTECTOMY  2015  ° right breast   ° °Family History  °Problem Relation Age of Onset  ° Hypertension Mother   ° Breast cancer Sister   ° °Social History  ° °Socioeconomic History  ° Marital status: Married  °  Spouse name: Not on file  ° Number of children: Not on file  ° Years of education: Not on file  ° Highest education level: Not on file  °Occupational History  ° Not on file  °Tobacco Use  ° Smoking status: Former  °  Packs/day: 1.00  °  Years: 10.00  °  Pack years: 10.00  °  Types: Cigarettes  °  Quit date: 09/18/1978  °  Years since quitting: 43.0  ° Smokeless tobacco: Never  °Vaping Use  ° Vaping Use: Never used  °Substance and Sexual Activity  ° Alcohol use: Yes  °  Comment: bottle a wine weekly  ° Drug use: No  ° Sexual activity: Not Currently  °Other Topics Concern  ° Not on file  °Social History Narrative  ° 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 [employer]  ° Married   ° No guns, wears seat belt, safe in relationship   ° 2 kids   ° -son in 07/2021 1st grandson/child  ° Former smoker   ° Retired 06/2021   ° °  Social Determinants of Health   Financial Resource Strain: Not on file  Food Insecurity: Not on file  Transportation Needs: Not on file  Physical Activity: Not on file  Stress: Not on file  Social Connections: Not on file  Intimate Partner Violence: Not on file   Current Meds  Medication Sig   Cholecalciferol (VITAMIN D PO) Take 2 tablets by mouth as needed. 2000 IU daily   [facility]S CALCIUM PO Take by mouth.    cyanocobalamin 2000 MCG tablet Take 2,000 mcg by mouth daily.    tolnaftate (DR GS CLEAR NAIL) 1 % external solution Apply 1 application topically 2 (two) times daily. pply a small amount of ClearNails-pro + to all ten toenails, twice daily and allow to dry completely before putting on socks.   Zoster Vaccine Adjuvanted [facility]) injection Inject 0.5 mLs into the muscle once for 1 dose. X 2 doses   [DISCONTINUED] aspirin EC 81 MG tablet Take 1 tablet (81 mg total) by mouth daily. Swallow whole.    [DISCONTINUED] atorvastatin (LIPITOR) 40 MG tablet Take 1 tablet (40 mg total) by mouth daily. At night d/c zocor 40 mg   [DISCONTINUED] citalopram (CELEXA) 20 MG tablet Take 1 tablet (20 mg total) by mouth daily.   [DISCONTINUED] ezetimibe (ZETIA) 10 MG tablet Take 1 tablet (10 mg total) by mouth daily.   [DISCONTINUED] hydrochlorothiazide (HYDRODIURIL) 25 MG tablet TAKE 1 TABLET DAILY IN THE MORNING   [DISCONTINUED] levothyroxine (SYNTHROID) 75 MCG tablet TAKE ONE TABLET BY MOUTH ONCE DAILY, TAKE ON AN EMPTY STOMACH AND WAIT 30 minutes before food   [DISCONTINUED] olmesartan (BENICAR) 20 MG tablet TAKE 1 TABLET DAILY (APPOINTMENT FOR FURTHER REFILLS)   Allergies  Allergen Reactions   Enalapril     Cough    Other Other (See Comments)    Medications ending in 'pril'; such as ace inhibitors   No results found for this or any previous visit (from the past 2160 hour(s)). Objective  Body mass index is 29.25 kg/m. Wt Readings from Last 3 Encounters:  09/15/21 181 lb 3.2 oz (82.2 kg)  11/09/20 183 lb 2 oz (83.1 kg)  07/14/20 181 lb (82.1 kg)   Temp Readings from Last 3 Encounters:  09/15/21 98.7 F (37.1 C) (Oral)  01/13/20 (!) 97.3 F (36.3 C)  10/22/19 97.6 F (36.4 C) (Temporal)   BP Readings from Last 3 Encounters:  09/15/21 130/70  11/09/20 120/62  01/13/20 (!) 110/58   Pulse Readings from Last 3 Encounters:  09/15/21 78  11/09/20 79  01/13/20 80    Physical Exam Vitals and nursing note reviewed.  Constitutional:      Appearance: Normal appearance. She is well-developed and well-groomed.  HENT:     Head: Normocephalic and atraumatic.  Eyes:     Conjunctiva/sclera: Conjunctivae normal.     Pupils: Pupils are equal, round, and reactive to light.  Cardiovascular:     Rate and Rhythm: Normal rate and regular rhythm.     Heart sounds: Normal heart sounds. No murmur heard. Pulmonary:     Effort: Pulmonary effort is normal.     Breath sounds: Normal breath sounds.   Abdominal:     General: Abdomen is flat. Bowel sounds are normal.     Tenderness: There is no abdominal tenderness.  Musculoskeletal:        General: No tenderness.  Skin:    General: Skin is warm and dry.  Neurological:     General: No focal deficit present.  Mental Status: She is alert and oriented to person, place, and time. Mental status is at baseline.  °   Cranial Nerves: Cranial nerves 2-12 are intact.  °   Motor: Motor function is intact.  °   Coordination: Coordination is intact.  °   Gait: Gait is intact.  °Psychiatric:     °   Attention and Perception: Attention and perception normal.     °   Mood and Affect: Mood and affect normal.     °   Speech: Speech normal.     °   Behavior: Behavior normal. Behavior is cooperative.     °   Thought Content: Thought content normal.     °   Cognition and Memory: Cognition and memory normal.     °   Judgment: Judgment normal.  ° ° °Assessment  °Plan  °Annual physical exam °See below  ° °Need for shingles vaccine - Plan: Zoster Vaccine Adjuvanted (SHINGRIX) injection ° °Osteopenia, unspecified location - Plan: DG Bone Density °Estrogen deficiency - Plan: DG Bone Density ° °Essential hypertension controlled- Plan: olmesartan (BENICAR) 20 MG tablet, hydrochlorothiazide (HYDRODIURIL) 25 MG tablet ° °Hypothyroidism, unspecified type - Plan: levothyroxine (SYNTHROID) 75 MCG tablet ° °Anxiety - Rx RF citalopram. Doing well. F/u prn. - Plan: citalopram (CELEXA) 20 MG tablet ° °Hyperlipidemia, unspecified hyperlipidemia type - Plan: atorvastatin (LIPITOR) 40 MG tablet ° °Onychomycosis - Plan: tolnaftate (DR GS CLEAR NAIL) 1 % external solution  °HM °Labs 01/2020 with [facility] [place], will mail [facility] orders for work again  °  °HM °Flu shot utd  °moderna 2/2 consider booster + 1 more  °Rec 4 total   °Tdap utd  °shingrix Rx today °Consider prevnar and pna 23 vaccines check at work if had  ° °Hep B 3/3 03/19/19, 10/09/18, 09/11/18  °Zoster vax 09/28/11  °  °MMR immune  neg hep C  °TSH nl 08/2018  °  ° °CA 125 cancer marker pending need to call [facility] never sent copy of final report  °CA 27.29 normal  °   °  °S/p b/l mastectomy for breast cancer no need further mammo, cancer markers per Dr. Rao 2021 °  °dexa 11/12/17 osteopenia on vitamin D3 2000 IU daily  °-repeat dexa  °  °Colonoscopy 02/11/14 normal repeat in 10 years but CT 06/24/2019 with acute diverticulitis  ° colonosocpy 10/22/19 Dr. Byrnett f/u in 10 years  °  °Pap [facility] 09/11/17 negative neg HPV  °  °Dermatology 2022  [facility] f/u 1 year  °fall 2023 will f/u  ° ° °Eye MD [facility]  °[facility]  °Surgery Dr. Byrnett   °  ° °Provider: Dr. Tracy McLean-Scocuzza-Internal Medicine  °

## 2021-09-19 ENCOUNTER — Telehealth: Payer: Self-pay | Admitting: Internal Medicine

## 2021-09-19 NOTE — Telephone Encounter (Signed)
Left message to return call. Have a signed release of information to fax over requesting the Patient's last lab results.  Needing their fax number.

## 2021-10-03 ENCOUNTER — Other Ambulatory Visit: Payer: Self-pay | Admitting: Internal Medicine

## 2021-10-03 DIAGNOSIS — F419 Anxiety disorder, unspecified: Secondary | ICD-10-CM

## 2021-11-22 ENCOUNTER — Other Ambulatory Visit: Payer: Self-pay | Admitting: Cardiovascular Disease

## 2022-03-29 DIAGNOSIS — D225 Melanocytic nevi of trunk: Secondary | ICD-10-CM | POA: Diagnosis not present

## 2022-03-29 DIAGNOSIS — Z85828 Personal history of other malignant neoplasm of skin: Secondary | ICD-10-CM | POA: Diagnosis not present

## 2022-03-29 DIAGNOSIS — D2262 Melanocytic nevi of left upper limb, including shoulder: Secondary | ICD-10-CM | POA: Diagnosis not present

## 2022-03-29 DIAGNOSIS — L814 Other melanin hyperpigmentation: Secondary | ICD-10-CM | POA: Diagnosis not present

## 2022-03-29 DIAGNOSIS — D2261 Melanocytic nevi of right upper limb, including shoulder: Secondary | ICD-10-CM | POA: Diagnosis not present

## 2022-04-21 ENCOUNTER — Ambulatory Visit: Payer: Medicare HMO | Attending: Medical | Admitting: Medical

## 2022-04-21 ENCOUNTER — Encounter: Payer: Self-pay | Admitting: Medical

## 2022-04-21 VITALS — BP 100/60 | HR 92 | Ht 66.0 in | Wt 181.4 lb

## 2022-04-21 DIAGNOSIS — I10 Essential (primary) hypertension: Secondary | ICD-10-CM | POA: Diagnosis not present

## 2022-04-21 DIAGNOSIS — I471 Supraventricular tachycardia: Secondary | ICD-10-CM | POA: Diagnosis not present

## 2022-04-21 DIAGNOSIS — I700 Atherosclerosis of aorta: Secondary | ICD-10-CM

## 2022-04-21 DIAGNOSIS — E782 Mixed hyperlipidemia: Secondary | ICD-10-CM | POA: Diagnosis not present

## 2022-04-21 NOTE — Progress Notes (Signed)
Cardiology Office Note:    Date:  04/21/2022   ID:  Adriana Cobb, DOB [birth year], MRN [id number]  PCP:  Adriana Cobb, Adriana Glow, MD  [facility] Cardiologist:  Adriana Rogue, MD  [place] Electrophysiologist:  None   Referring MD: Adriana Cobb, Adriana Cobb *   Chief Complaint: 12 month follow-up  History of Present Illness:    Adriana Cobb is a 71 y.o. female with a hx of breast cancer on the left, mastectomy/chemotherapy/radiation treatments in 2006, SVT treated with adenosine, HLD, former smoker who presents with 12 month follow-up.   The patient had 2 episodes of SVT in 2008 treated with adenosine, initially started on amiodarone and changed to Sotalol. Due to no reoccurrence, sotalol was since discontinued.   Today, the patient reports she has been going well, enjoying retired life. She retired in January.  No chest pain or sob. NO LLE, orthopnea, pnd. No palpitations or lightheadedness or dizziness. She will have labs with PCP. Diet could be better, but it's generally could. She plans on getting back to routine walking.   Past Medical History:  Diagnosis Date   BRCA negative 2016   MyRisk neg   Breast cancer ([place])    Breast neoplasm, Tis (DCIS), right 08/11/2017   Intermediate grade DCIS; ER: 90%; PR: 30 %.    Cancer ([place]) 2006   pT2,N2a,Mx; ER/PR negative, Her 2 neu: 3+ ([facility]). Dermal lymphatic involvement, 9/ 22 nodes positive with extranodal extension.  Treated with Cytoxan, Adriamycin, Taxol and Herceptin.   H/O cystitis    Hemorrhoids    Hot flashes    Hx of migraines    Hyperlipidemia    Hypothyroid    Osteopenia 11/2017   [facility] at [facility]   Palpitations    Paroxysmal supraventricular tachycardia ([place])    Personal history of tobacco use, presenting hazards to health    Skin cancer    nmsc Dr. Kellie Cobb    Unspecified essential hypertension    Unspecified hypothyroidism     Past Surgical History:  Procedure Laterality Date   BREAST BIOPSY Left 2006    stereo breast bx   BREAST RECONSTRUCTION Bilateral 06/08/14   CESAREAN SECTION  1987, 1988   x 2   COLONOSCOPY  2007   Dr. Allen Cobb   COLONOSCOPY WITH PROPOFOL N/A 10/22/2019   Procedure: COLONOSCOPY WITH PROPOFOL;  Surgeon: Adriana Bellow, MD;  Location: [facility] ENDOSCOPY;  Service: Endoscopy;  Laterality: N/A;   MASTECTOMY Left 2006   left breast   MASTECTOMY  2015   right breast     Current Medications: Current Meds  Medication Sig   aspirin EC 81 MG tablet Take 1 tablet (81 mg total) by mouth daily. Swallow whole.   atorvastatin (LIPITOR) 40 MG tablet Take 1 tablet (40 mg total) by mouth daily. At night d/c zocor 40 mg   Cholecalciferol (VITAMIN D PO) Take 2 tablets by mouth as needed. 2000 IU daily   citalopram (CELEXA) 20 MG tablet TAKE 1 TABLET DAILY   CVS CALCIUM PO Take by mouth.    cyanocobalamin 2000 MCG tablet Take 2,000 mcg by mouth daily.    ezetimibe (ZETIA) 10 MG tablet Take 1 tablet (10 mg total) by mouth daily.   hydrochlorothiazide (HYDRODIURIL) 25 MG tablet Take 1 tablet (25 mg total) by mouth every morning.   levothyroxine (SYNTHROID) 75 MCG tablet TAKE ONE TABLET BY MOUTH ONCE DAILY, TAKE ON AN EMPTY STOMACH AND WAIT 30 minutes before food   olmesartan (BENICAR) 20 MG tablet  TAKE 1 TABLET DAILY (APPOINTMENT FOR FURTHER REFILLS)   tolnaftate (DR GS CLEAR NAIL) 1 % external solution Apply 1 application topically 2 (two) times daily. pply a small amount of ClearNails-pro + to all ten toenails, twice daily and allow to dry completely before putting on socks.     Allergies:   Enalapril and Other   Social History   Socioeconomic History   Marital status: Married    Spouse name: Not on file   Number of children: Not on file   Years of education: Not on file   Highest education level: Not on file  Occupational History   Not on file  Tobacco Use   Smoking status: Former    Packs/day: 1.00    Years: 10.00    Total pack years: 10.00    Types: Cigarettes     Quit date: 09/18/1978    Years since quitting: 43.6   Smokeless tobacco: Never  Vaping Use   Vaping Use: Never used  Substance and Sexual Activity   Alcohol use: Yes    Comment: bottle a wine weekly   Drug use: No   Sexual activity: Not Currently  Other Topics Concern   Not on file  Social History Narrative   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  ROS:   Please see the history of present illness.     All other systems reviewed and are negative.  EKGs/Labs/Other Studies Reviewed:    The following studies were reviewed today:  Echocardiogram in 2008 was essentially normal with mild MR Echocardiogram May 2009 documented moderate pulmonic regurgitation and moderate MR Echocardiogram November 2009 showed mild to moderate MR Stress test June 2000 and shows no ischemia, she exercised on treadmill for 8 minutes 30 seconds achieved 10 METS  EKG:  EKG is ordered today.  The ekg ordered today demonstrates NSR 92bpm, nonspecific T wave changes  Recent Labs: No results found for requested labs within last 365 days.  Recent Lipid Panel No results found for: "CHOL", "TRIG", "HDL", "CHOLHDL", "VLDL", "LDLCALC", "LDLDIRECT"   Physical Exam:    VS:  BP 100/60 (BP Location: Left Arm, Patient Position: Sitting, Cuff Size: Normal)   Pulse 92   Ht '5\' 6"'  (1.676 m)   Wt 181 lb 6 oz (82.3 kg)   SpO2 98%   BMI 29.27 kg/m     Wt Readings from Last 3 Encounters:  04/21/22 181 lb 6 oz (82.3 kg)  09/15/21 181  lb 3.2 oz (82.2 kg)  11/09/20 183 lb 2 oz (83.1 kg)     GEN:  Well nourished, well developed in no acute distress HEENT: Normal NECK: No JVD; No carotid bruits LYMPHATICS: No lymphadenopathy CARDIAC: RRR, no murmurs, rubs, gallops RESPIRATORY:  Clear to auscultation without rales, wheezing or rhonchi  ABDOMEN: Soft, non-tender, non-distended MUSCULOSKELETAL:  No edema; No deformity  SKIN: Warm and dry NEUROLOGIC:  Alert and oriented x 3  PSYCHIATRIC:  Normal affect   ASSESSMENT:    1. Paroxysmal SVT (supraventricular tachycardia) ([place])   2. Essential hypertension   3. Hyperlipidemia, mixed   4. Aortic atherosclerosis (HCC)    PLAN:    In order of problems listed above:  H/o SVT Patient was previously on amiodarone and sotalol. No reoccurrence noted. She denies palpitations. No further work-up. PCP will get annual labs.   HLD PCP will update labs as above. Continue Lipitor 17m daily. Goal LDL <70.   Aortic Atherosclerosis 2020 Noted on imaging in 2021. Continue Aspirin, Lipitor and Zetia.   HTN BP good today, Continue HCTZ and Benicar.   Disposition: Follow up in 1 year(s) with MD/APP    Signed, Sumiye Hirth HNinfa Meeker PA-C  04/21/2022 3:05 PM    [facility]

## 2022-04-21 NOTE — Patient Instructions (Signed)
Medication Instructions:  Your physician recommends that you continue on your current medications as directed. Please refer to the Current Medication list given to you today.  *If you need a refill on your cardiac medications before your next appointment, please call your pharmacy*   Lab Work: None  If you have labs (blood work) drawn today and your tests are completely normal, you will receive your results only by: [place] (if you have MyChart) OR A paper copy in the mail If you have any lab test that is abnormal or we need to change your treatment, we will call you to review the results.   Testing/Procedures: None   Follow-Up: At [facility], you and your health needs are our priority.  As part of our continuing mission to provide you with exceptional heart care, we have created designated Provider Care Teams.  These Care Teams include your primary Cardiologist (physician) and Advanced Practice Providers (APPs -  Physician Assistants and Nurse Practitioners) who all work together to provide you with the care you need, when you need it.   Your next appointment:   1 year(s)  The format for your next appointment:   In Person  Provider:   Ida Rogue, MD or Cadence Kathlen Mody, [place]       Important Information About Sugar

## 2022-06-13 ENCOUNTER — Ambulatory Visit (INDEPENDENT_AMBULATORY_CARE_PROVIDER_SITE_OTHER): Payer: Medicare HMO | Admitting: Family Medicine

## 2022-06-13 ENCOUNTER — Encounter: Payer: Self-pay | Admitting: Family Medicine

## 2022-06-13 VITALS — BP 122/72 | HR 82 | Temp 98.0°F | Ht 66.0 in | Wt 180.8 lb

## 2022-06-13 DIAGNOSIS — E782 Mixed hyperlipidemia: Secondary | ICD-10-CM

## 2022-06-13 DIAGNOSIS — Z780 Asymptomatic menopausal state: Secondary | ICD-10-CM

## 2022-06-13 DIAGNOSIS — I10 Essential (primary) hypertension: Secondary | ICD-10-CM | POA: Diagnosis not present

## 2022-06-13 DIAGNOSIS — R7989 Other specified abnormal findings of blood chemistry: Secondary | ICD-10-CM

## 2022-06-13 DIAGNOSIS — E039 Hypothyroidism, unspecified: Secondary | ICD-10-CM

## 2022-06-13 DIAGNOSIS — R7309 Other abnormal glucose: Secondary | ICD-10-CM

## 2022-06-13 DIAGNOSIS — Z13820 Encounter for screening for osteoporosis: Secondary | ICD-10-CM

## 2022-06-13 DIAGNOSIS — R748 Abnormal levels of other serum enzymes: Secondary | ICD-10-CM

## 2022-06-13 DIAGNOSIS — Z23 Encounter for immunization: Secondary | ICD-10-CM | POA: Diagnosis not present

## 2022-06-13 LAB — COMPREHENSIVE METABOLIC PANEL
ALT: 41 U/L — ABNORMAL HIGH (ref 0–35)
AST: 22 U/L (ref 0–37)
Albumin: 4.5 g/dL (ref 3.5–5.2)
Alkaline Phosphatase: 84 U/L (ref 39–117)
BUN: 11 mg/dL (ref 6–23)
CO2: 31 mEq/L (ref 19–32)
Calcium: 9.7 mg/dL (ref 8.4–10.5)
Chloride: 98 mEq/L (ref 96–112)
Creatinine, Ser: 0.71 mg/dL (ref 0.40–1.20)
GFR: 85.83 mL/min (ref >60.00)
Glucose, Bld: 92 mg/dL (ref 70–99)
Potassium: 4.3 mEq/L (ref 3.5–5.1)
Sodium: 137 mEq/L (ref 135–145)
Total Bilirubin: 0.4 mg/dL (ref 0.2–1.2)
Total Protein: 7.2 g/dL (ref 6.0–8.3)

## 2022-06-13 LAB — CBC WITH DIFFERENTIAL/PLATELET
Basophils Absolute: 0 10*3/uL (ref 0.0–0.1)
Basophils Relative: 0.4 % (ref 0.0–3.0)
Eosinophils Absolute: 0.4 10*3/uL (ref 0.0–0.7)
Eosinophils Relative: 5.7 % — ABNORMAL HIGH (ref 0.0–5.0)
HCT: 42 % (ref 36.0–46.0)
Hemoglobin: 14.2 g/dL (ref 12.0–15.0)
Lymphocytes Relative: 28.4 % (ref 12.0–46.0)
Lymphs Abs: 1.9 10*3/uL (ref 0.7–4.0)
MCHC: 33.8 g/dL (ref 30.0–36.0)
MCV: 95.3 fl (ref 78.0–100.0)
Monocytes Absolute: 0.4 10*3/uL (ref 0.1–1.0)
Monocytes Relative: 6.3 % (ref 3.0–12.0)
Neutro Abs: 3.9 10*3/uL (ref 1.4–7.7)
Neutrophils Relative %: 59.2 % (ref 43.0–77.0)
Platelets: 274 10*3/uL (ref 150.0–400.0)
RBC: 4.4 Mil/uL (ref 3.87–5.11)
RDW: 12.5 % (ref 11.5–15.5)
WBC: 6.5 10*3/uL (ref 4.0–10.5)

## 2022-06-13 LAB — LIPID PANEL
Cholesterol: 130 mg/dL (ref 0–200)
HDL: 58.1 mg/dL (ref >39.00)
LDL Cholesterol: 57 mg/dL (ref 0–99)
NonHDL: 71.85
Total CHOL/HDL Ratio: 2
Triglycerides: 74 mg/dL (ref 0.0–149.0)
VLDL: 14.8 mg/dL (ref 0.0–40.0)

## 2022-06-13 LAB — VITAMIN D 25 HYDROXY (VIT D DEFICIENCY, FRACTURES): VITD: 30.85 ng/mL (ref 30.00–100.00)

## 2022-06-13 LAB — TSH: TSH: 1.55 u[IU]/mL (ref 0.35–5.50)

## 2022-06-13 LAB — VITAMIN B12: Vitamin B-12: 237 pg/mL (ref 211–911)

## 2022-06-13 LAB — HEMOGLOBIN A1C: Hgb A1c MFr Bld: 5.9 % (ref 4.6–6.5)

## 2022-06-13 NOTE — Progress Notes (Signed)
    SUBJECTIVE:   CHIEF COMPLAINT / HPI: transfer care  Patient presents to clinic to transfer care  No acute concerns  HTN Asymptomatic.  Takes HCTZ 25 mg and  Olmesartan 20 mg daily. Tolerating medications well.  Hypothyroid Asymptomatic. Takes Levothyroxine 75 mcg daily. Tolerating well.  HLD On Lipitor 40 mg and Zetia 10 mg daily.  Tolerating medication well.  No myalgias  PERTINENT  PMH / PSH:  HTN HLD Thyroid disorder  OBJECTIVE:   BP 122/72 (BP Location: Left Arm, Patient Position: Sitting, Cuff Size: Normal)   Pulse 82   Temp 98 F (36.7 C) (Oral)   Ht '5\' 6"'$  (1.676 m)   Wt 180 lb 12.8 oz (82 kg)   SpO2 98%   BMI 29.18 kg/m    General: Alert, no acute distress Cardio: Normal S1 and S2, RRR, no r/m/g Pulm: CTAB, normal work of breathing Abdomen: Bowel sounds normal. Abdomen soft and non-tender.  Extremities: No peripheral edema.  Neuro: Cranial nerves grossly intact   ASSESSMENT/PLAN:   HTN (hypertension) Well controlled on current medications.   -Continue HCTZ  mg daily -Continue Olmesartan 20 mg daily -Follow up in 4 months   Hypothyroidism Chronic. Stable -Continue Levothyroxine 75 mcg daily -Repeat TSH, adjust medication accordingly  Hyperlipidemia On statin. No myalgia -Continue Lipitor 40 mg daily -Repeat fasting Lipids    HCM Colonoscopy UTD,due 2031 Mammogram not indicated, s/p bilateral mastectomy Aged out for PAP Referral for Dexa scan, osteoporosis screening  Recommend Shingles vaccine Recommend RSV vaccine PCV 20 vaccine today Labs ordered for future annual visit  PDMP Reviewed  Carollee Leitz, MD

## 2022-06-13 NOTE — Patient Instructions (Addendum)
It was a pleasure meeting you today. Thank you for allowing me to take part in your health care.  Our goals for today as we discussed include:  For your blood pressure Continue Hydrochlorothiazide 25 mg daily Continue Benicar 20 mg daily  For your thyroid Continue Levothyroxine 75 mcg daily  For your cholesterol Continue Lipitor 40 mg daily Continue Zetia 10 mg daily Recommend diet modification and increase in exercise  We will get some labs today.  If they are abnormal or we need to do something about them, I will call you.  If they are normal, I will send you a message on MyChart (if it is active) or a letter in the mail.  If you don't hear from [place] in 2 weeks, please call the office at the number below.   Referral sent for Dexa scan. Please call to schedule appointment. [facility] [place], [place] [address] [phone]   You have received your Pneumonia 20 vaccine today  Recommend Shingles vaccine.  This is a 2 dose series and can be given at your local pharmacy.  Please talk to your pharmacist about this.   Recommend RSV vaccine.  This can be given at the pharmacy.  Please follow-up with PCP as scheduled in February  If you have any questions or concerns, please do not hesitate to call the office at [phone].  I look forward to our next visit and until then take care and stay safe.  Regards,   Carollee Leitz, MD   [facility]

## 2022-06-25 ENCOUNTER — Encounter: Payer: Self-pay | Admitting: Family Medicine

## 2022-06-25 NOTE — Assessment & Plan Note (Signed)
Chronic. Stable -Continue Levothyroxine 75 mcg daily -Repeat TSH, adjust medication accordingly

## 2022-06-25 NOTE — Assessment & Plan Note (Signed)
Well controlled on current medications.   -Continue HCTZ  mg daily -Continue Olmesartan 20 mg daily -Follow up in 4 months

## 2022-06-25 NOTE — Assessment & Plan Note (Signed)
On statin. No myalgia -Continue Lipitor 40 mg daily -Repeat fasting Lipids

## 2022-07-26 ENCOUNTER — Other Ambulatory Visit: Payer: Self-pay

## 2022-07-26 DIAGNOSIS — I10 Essential (primary) hypertension: Secondary | ICD-10-CM

## 2022-07-26 DIAGNOSIS — E785 Hyperlipidemia, unspecified: Secondary | ICD-10-CM

## 2022-07-26 DIAGNOSIS — E039 Hypothyroidism, unspecified: Secondary | ICD-10-CM

## 2022-07-26 MED ORDER — LEVOTHYROXINE SODIUM 75 MCG PO TABS: ORAL_TABLET | ORAL | 3 refills | Status: DC

## 2022-07-26 MED ORDER — ATORVASTATIN CALCIUM 40 MG PO TABS: 40.0000 mg | ORAL_TABLET | Freq: Every day | ORAL | 3 refills | Status: DC

## 2022-07-26 MED ORDER — OLMESARTAN MEDOXOMIL 20 MG PO TABS: ORAL_TABLET | ORAL | 3 refills | Status: DC

## 2022-09-18 NOTE — Patient Instructions (Incomplete)
It was a pleasure meeting you today. Thank you for allowing me to take part in your health care.  Our goals for today as we discussed include:  Schedule Annual Medicare Wellness visit on Tues or Thurs afternoon   If you have any questions or concerns, please do not hesitate to call the office at [phone].  I look forward to our next visit and until then take care and stay safe.  Regards,   Carollee Leitz, MD   [facility]

## 2022-09-19 ENCOUNTER — Ambulatory Visit (INDEPENDENT_AMBULATORY_CARE_PROVIDER_SITE_OTHER): Payer: Medicare HMO | Admitting: Family Medicine

## 2022-09-19 ENCOUNTER — Encounter: Payer: Medicare HMO | Admitting: Internal Medicine

## 2022-09-19 ENCOUNTER — Encounter: Payer: Self-pay | Admitting: Family Medicine

## 2022-09-19 ENCOUNTER — Encounter: Payer: Medicare HMO | Admitting: Family Medicine

## 2022-09-19 VITALS — BP 122/76 | HR 76 | Temp 98.0°F | Ht 66.0 in | Wt 181.6 lb

## 2022-09-19 DIAGNOSIS — E039 Hypothyroidism, unspecified: Secondary | ICD-10-CM | POA: Diagnosis not present

## 2022-09-19 DIAGNOSIS — F419 Anxiety disorder, unspecified: Secondary | ICD-10-CM

## 2022-09-19 DIAGNOSIS — M8580 Other specified disorders of bone density and structure, unspecified site: Secondary | ICD-10-CM

## 2022-09-19 DIAGNOSIS — I10 Essential (primary) hypertension: Secondary | ICD-10-CM | POA: Diagnosis not present

## 2022-09-19 DIAGNOSIS — I700 Atherosclerosis of aorta: Secondary | ICD-10-CM | POA: Diagnosis not present

## 2022-09-19 DIAGNOSIS — E785 Hyperlipidemia, unspecified: Secondary | ICD-10-CM

## 2022-09-19 DIAGNOSIS — F39 Unspecified mood [affective] disorder: Secondary | ICD-10-CM | POA: Diagnosis not present

## 2022-09-19 DIAGNOSIS — Z0000 Encounter for general adult medical examination without abnormal findings: Secondary | ICD-10-CM

## 2022-09-19 MED ORDER — CITALOPRAM HYDROBROMIDE 20 MG PO TABS: 20.0000 mg | ORAL_TABLET | Freq: Every day | ORAL | 3 refills | Status: DC

## 2022-09-19 MED ORDER — LEVOTHYROXINE SODIUM 75 MCG PO TABS: ORAL_TABLET | ORAL | 3 refills | Status: DC

## 2022-09-19 MED ORDER — OLMESARTAN MEDOXOMIL 20 MG PO TABS: ORAL_TABLET | ORAL | 3 refills | Status: DC

## 2022-09-19 MED ORDER — ATORVASTATIN CALCIUM 40 MG PO TABS: 40.0000 mg | ORAL_TABLET | Freq: Every day | ORAL | 3 refills | Status: DC

## 2022-09-19 MED ORDER — EZETIMIBE 10 MG PO TABS: 10.0000 mg | ORAL_TABLET | Freq: Every day | ORAL | 4 refills | Status: DC

## 2022-09-19 MED ORDER — HYDROCHLOROTHIAZIDE 25 MG PO TABS: 25.0000 mg | ORAL_TABLET | Freq: Every morning | ORAL | 3 refills | Status: DC

## 2022-09-19 NOTE — Progress Notes (Signed)
SUBJECTIVE:   Chief Complaint  Patient presents with   Annual Exam   HPI Patient presents to clinic for annual physical  No acute concerns today. Requesting refills for Lipitor, Synthroid and Benicar.  Hypertension Asymptomatic.  Well-controlled with Benicar 20 mg daily and hydrochlorothiazide 25 mg daily  Hyperlipidemia Taking Lipitor 40 mg and Zetia 10 mg daily and tolerating well.  No myalgias  Hypothyroidism Asymptomatic.  Taking levothyroxine 75 mcg daily and tolerating well.  Mood disorder Long-term use of Celexa 20 mg daily and tolerating well.  Denies any SI/HI.  PERTINENT PMH / PSH: Hypothyroidism Hypertension Hyperlipidemia Mood disorder  OBJECTIVE:  BP 122/76   Pulse 76   Temp 98 F (36.7 C)   Ht 5' 6"$  (1.676 m)   Wt 181 lb 9.6 oz (82.4 kg)   SpO2 99%   BMI 29.31 kg/m    Physical Exam Vitals reviewed.  Constitutional:      General: She is not in acute distress.    Appearance: Normal appearance. She is normal weight. She is not ill-appearing, toxic-appearing or diaphoretic.  Eyes:     General:        Right eye: No discharge.        Left eye: No discharge.     Conjunctiva/sclera: Conjunctivae normal.  Neck:     Thyroid: No thyromegaly or thyroid tenderness.  Cardiovascular:     Rate and Rhythm: Normal rate and regular rhythm.     Heart sounds: Normal heart sounds.  Pulmonary:     Effort: Pulmonary effort is normal.     Breath sounds: Normal breath sounds.  Abdominal:     General: Bowel sounds are normal.  Musculoskeletal:        General: Normal range of motion.  Skin:    General: Skin is warm and dry.  Neurological:     General: No focal deficit present.     Mental Status: She is alert and oriented to person, place, and time. Mental status is at baseline.  Psychiatric:        Mood and Affect: Mood normal.        Behavior: Behavior normal.        Thought Content: Thought content normal.        Judgment: Judgment normal.      ASSESSMENT/PLAN:  Primary hypertension Assessment & Plan: Chronic.  Stable.  Well controlled on current medications.   -Refill HCTZ 25 mg daily -Refill olmesartan 20 mg daily   Orders: -     hydroCHLOROthiazide; Take 1 tablet (25 mg total) by mouth every morning.  Dispense: 90 tablet; Refill: 3 -     Olmesartan Medoxomil; TAKE 1 TABLET DAILY (APPOINTMENT FOR FURTHER REFILLS)  Dispense: 90 tablet; Refill: 3  Mood disorder (HCC) Assessment & Plan: Chronic.  Primarily anxiety.  Currently on Celexa and doing well.  Denies any SI/HI. Continue Celexa 20 mg daily Encourage CBT as needed. Follow-up as needed  Orders: -     Citalopram Hydrobromide; Take 1 tablet (20 mg total) by mouth daily.  Dispense: 90 tablet; Refill: 3  Aortic atherosclerosis (HCC) Assessment & Plan: Noted on CT abdomen/pelvis 06/24/2019 Currently on statin therapy Continue Lipitor 40 mg daily   Annual physical exam Assessment & Plan: Bone density scheduled for March Mammogram not indicated for history of bilateral breast mastectomy Colonoscopy up-to-date Tetanus up-to-date Recommend RSV vaccine Shingles vaccine up-to-date Pneumonia 20.  Vaccination completed Annual flu vaccine up-to-date   Hyperlipidemia, unspecified hyperlipidemia type Assessment & Plan: On  statin. No myalgia -Refill Lipitor 40 mg daily   Orders: -     Ezetimibe; Take 1 tablet (10 mg total) by mouth daily.  Dispense: 90 tablet; Refill: 4 -     Atorvastatin Calcium; Take 1 tablet (40 mg total) by mouth daily. At night d/c zocor 40 mg  Dispense: 90 tablet; Refill: 3  Hypothyroidism, unspecified type Assessment & Plan: Chronic. Stable.  Recent TSH normal -Refill levothyroxine 75 mcg daily   Orders: -     Levothyroxine Sodium; TAKE ONE TABLET BY MOUTH ONCE DAILY, TAKE ON AN EMPTY STOMACH AND WAIT 30 minutes before food  Dispense: 90 tablet; Refill: 3  Osteopenia, unspecified location Assessment & Plan: DEXA scan  scheduled for March Recommend calcium 1200 mg daily Vitamin D 800 IU daily    PDMP reviewed  Return in about 1 year (around 09/20/2023) for annual visit with fasting labs 1 week prior.  Carollee Leitz, MD

## 2022-09-22 ENCOUNTER — Encounter: Payer: Self-pay | Admitting: Family Medicine

## 2022-09-22 DIAGNOSIS — I700 Atherosclerosis of aorta: Secondary | ICD-10-CM | POA: Insufficient documentation

## 2022-09-22 DIAGNOSIS — Z0000 Encounter for general adult medical examination without abnormal findings: Secondary | ICD-10-CM | POA: Insufficient documentation

## 2022-09-22 DIAGNOSIS — F39 Unspecified mood [affective] disorder: Secondary | ICD-10-CM | POA: Insufficient documentation

## 2022-09-22 NOTE — Assessment & Plan Note (Signed)
DEXA scan scheduled for March Recommend calcium 1200 mg daily Vitamin D 800 IU daily

## 2022-09-22 NOTE — Assessment & Plan Note (Signed)
Noted on CT abdomen/pelvis 06/24/2019 Currently on statin therapy Continue Lipitor 40 mg daily

## 2022-09-22 NOTE — Assessment & Plan Note (Signed)
Chronic. Stable.  Recent TSH normal -Refill levothyroxine 75 mcg daily

## 2022-09-22 NOTE — Assessment & Plan Note (Signed)
Bone density scheduled for March Mammogram not indicated for history of bilateral breast mastectomy Colonoscopy up-to-date Tetanus up-to-date Recommend RSV vaccine Shingles vaccine up-to-date Pneumonia 20.  Vaccination completed Annual flu vaccine up-to-date

## 2022-09-22 NOTE — Assessment & Plan Note (Signed)
On statin. No myalgia -Refill Lipitor 40 mg daily

## 2022-09-22 NOTE — Assessment & Plan Note (Signed)
Chronic.  Primarily anxiety.  Currently on Celexa and doing well.  Denies any SI/HI. Continue Celexa 20 mg daily Encourage CBT as needed. Follow-up as needed

## 2022-09-22 NOTE — Assessment & Plan Note (Addendum)
Chronic.  Stable.  Well controlled on current medications.   -Refill HCTZ 25 mg daily -Refill olmesartan 20 mg daily

## 2022-10-03 ENCOUNTER — Encounter: Payer: Self-pay | Admitting: *Deleted

## 2022-10-03 ENCOUNTER — Ambulatory Visit (INDEPENDENT_AMBULATORY_CARE_PROVIDER_SITE_OTHER): Payer: Medicare HMO | Admitting: *Deleted

## 2022-10-03 VITALS — BP 122/76 | Ht 66.0 in | Wt 181.6 lb

## 2022-10-03 DIAGNOSIS — Z0000 Encounter for general adult medical examination without abnormal findings: Secondary | ICD-10-CM | POA: Diagnosis not present

## 2022-10-03 NOTE — Patient Instructions (Signed)
  Adriana Cobb , Thank you for taking time to come for your Medicare Wellness Visit. I appreciate your ongoing commitment to your health goals. Please review the following plan we discussed and let me know if I can assist you in the future.   These are the goals we discussed:  Goals   None     This is a list of the screening recommended for you and due dates:  Health Maintenance  Topic Date Due   COVID-19 Vaccine (3 - Moderna risk series) 09/17/2020   Zoster (Shingles) Vaccine (2 of 2) 12/19/2022*   Medicare Annual Wellness Visit  10/04/2023   Colon Cancer Screening  10/21/2029   DTaP/Tdap/Td vaccine (2 - Td or Tdap) 01/15/2030   Pneumonia Vaccine  Completed   Flu Shot  Completed   DEXA scan (bone density measurement)  Completed   Hepatitis C Screening: USPSTF Recommendation to screen - Ages 40-79 yo.  Completed   HPV Vaccine  Aged Out   Mammogram  Discontinued  *Topic was postponed. The date shown is not the original due date.

## 2022-10-03 NOTE — Progress Notes (Signed)
I connected with  Adriana Cobb on 10/03/22 by a audio enabled telemedicine application and verified that I am speaking with the correct person using two identifiers.  Patient Location: Home  Provider Location: Office/Clinic  I discussed the limitations of evaluation and management by telemedicine. The patient expressed understanding and agreed to proceed.   Subjective:   Adriana Cobb is a 72 y.o. female who presents for an Initial Medicare Annual Wellness Visit.  Cardiac Risk Factors include: none     Objective:    Today's Vitals   10/03/22 1602  BP: 122/76  Weight: 181 lb 9.6 oz (82.4 kg)  Height: '5\' 6"'$  (1.676 m)   Body mass index is 29.31 kg/m.     10/03/2022    4:11 PM 01/29/2020   10:43 AM 10/22/2019    8:28 AM  Advanced Directives  Does Patient Have a Medical Advance Directive? Yes No No  Type of Advance Directive Living will    Does patient want to make changes to medical advance directive? Yes (ED - Information included in AVS)    Would patient like information on creating a medical advance directive?  Yes (MAU/Ambulatory/Procedural Areas - Information given)     Current Medications (verified) Outpatient Encounter Medications as of 10/03/2022  Medication Sig   atorvastatin (LIPITOR) 40 MG tablet Take 1 tablet (40 mg total) by mouth daily. At night d/c zocor 40 mg   Cholecalciferol (VITAMIN D PO) Take 2 tablets by mouth as needed. 2000 IU daily   citalopram (CELEXA) 20 MG tablet Take 1 tablet (20 mg total) by mouth daily.   [facility] CALCIUM PO Take by mouth.    cyanocobalamin 2000 MCG tablet Take 2,000 mcg by mouth daily.    ezetimibe (ZETIA) 10 MG tablet Take 1 tablet (10 mg total) by mouth daily.   hydrochlorothiazide (HYDRODIURIL) 25 MG tablet Take 1 tablet (25 mg total) by mouth every morning.   levothyroxine (SYNTHROID) 75 MCG tablet TAKE ONE TABLET BY MOUTH ONCE DAILY, TAKE ON AN EMPTY STOMACH AND WAIT 30 minutes before food   olmesartan (BENICAR) 20 MG  tablet TAKE 1 TABLET DAILY (APPOINTMENT FOR FURTHER REFILLS)   No facility-administered encounter medications on file as of 10/03/2022.    Allergies (verified) Enalapril and Other   History: Past Medical History:  Diagnosis Date   BRCA negative 2016   MyRisk neg   Breast cancer ([place])    Breast neoplasm, Tis (DCIS), right 08/11/2017   Intermediate grade DCIS; ER: 90%; PR: 30 %.    Cancer ([place]) 2006   pT2,N2a,Mx; ER/PR negative, Her 2 neu: 3+ ([facility]). Dermal lymphatic involvement, 9/ 22 nodes positive with extranodal extension.  Treated with Cytoxan, Adriamycin, Taxol and Herceptin.   DCIS (ductal carcinoma in situ) right breast, ER/PR pos 09/04/2013   H/O cystitis    Hemorrhoids    Hot flashes    Hot flashes 09/02/2018   Hx of migraines    Hyperlipidemia    Hypothyroid    Neck pain 09/01/2018   Osteopenia 11/2017   [facility] at [facility]   Pain in left axilla 09/01/2018   Palpitations    Paroxysmal supraventricular tachycardia    Paroxysmal SVT (supraventricular tachycardia) 12/31/2012   Personal history of tobacco use, presenting hazards to health    Skin cancer    nmsc Dr. Kellie Moor    Unspecified essential hypertension    Unspecified hypothyroidism    Past Surgical History:  Procedure Laterality Date   BREAST BIOPSY Left 2006  stereo breast bx   BREAST RECONSTRUCTION Bilateral 06/08/14   CESAREAN SECTION  1987, 1988   x 2   COLONOSCOPY  2007   Dr. Allen Norris   COLONOSCOPY WITH PROPOFOL N/A 10/22/2019   Procedure: COLONOSCOPY WITH PROPOFOL;  Surgeon: Robert Bellow, MD;  Location: [facility] ENDOSCOPY;  Service: Endoscopy;  Laterality: N/A;   MASTECTOMY Left 2006   left breast   MASTECTOMY  2015   right breast    Family History  Problem Relation Age of Onset   Hypertension Mother    Breast cancer Sister    Social History   Socioeconomic History   Marital status: Married    Spouse name: Not on file   Number of children: Not on file   Years of education: Not on file    Highest education level: Not on file  Occupational History   Not on file  Tobacco Use   Smoking status: Former    Packs/day: 1.00    Years: 10.00    Total pack years: 10.00    Types: Cigarettes    Quit date: 09/18/1978    Years since quitting: 44.0   Smokeless tobacco: Never  Vaping Use   Vaping Use: Never used  Substance and Sexual Activity   Alcohol use: Yes    Comment: bottle a wine weekly   Drug use: No   Sexual activity: Not Currently  Other Topics Concern   Not on file  Social History Narrative   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 [employer]   Married    No guns, wears seat belt, safe in relationship    2 kids    -son in 07/2021 1st grandson/child   Former smoker    Retired 06/2021    Social Determinants of Health   Financial Resource Strain: Not on file  Food Insecurity: No Food Insecurity (10/03/2022)   Hunger Vital Sign    Worried About Running Out of Food in the Last Year: Never true    [employer] in the Last Year: Never true  Transportation Needs: No Transportation Needs (10/03/2022)   PRAPARE - Hydrologist (Medical): No    Lack of Transportation (Non-Medical): No  Physical Activity: Not on file  Stress: Not on file  Social Connections: Moderately Isolated (10/03/2022)   Social Connection and Isolation Panel [NHANES]    Frequency of Communication with Friends and Family: Three times a week    Frequency of Social Gatherings with Friends and Family: Three times a week    Attends Religious Services: Never    Active Member of Clubs or Organizations: No    Attends Music therapist: Never    Marital Status: Married    Tobacco Counseling Counseling given: Not Answered   Clinical Intake:  Pre-visit preparation completed: Yes  Pain : No/denies pain     BMI - recorded: 29.32 Nutritional Status: BMI 25 -29 Overweight Nutritional Risks: None Diabetes: No  How often do you need to have someone help you when you read  instructions, pamphlets, or other written materials from your doctor or pharmacy?: 1 - Never  Diabetic? No  Interpreter Needed?: No  Information entered by :: Jari Favre, CMA   Activities of Daily Living    10/03/2022    4:08 PM  In your present state of health, do you have any difficulty performing the following activities:  Hearing? 0  Vision? 0  Difficulty concentrating or making decisions? 0  Walking or climbing stairs?  0  Dressing or bathing? 0  Doing errands, shopping? 0  Preparing Food and eating ? N  Using the Toilet? N  In the past six months, have you accidently leaked urine? N  Do you have problems with loss of bowel control? N  Managing your Medications? N  Managing your Finances? N  Housekeeping or managing your Housekeeping? N    Patient Care Team: Carollee Leitz, MD as PCP - General (Family Medicine) Rockey Situ Kathlene November, MD as PCP - Cardiology (Cardiology) Christene Lye, MD (General Surgery) Perrin Maltese, MD as Referring Physician (Internal Medicine) Minna Merritts, MD as Consulting Physician (Cardiology)  Indicate any recent Medical Services you may have received from other than [facility] providers in the past year (date may be approximate).     Assessment:   This is a routine wellness examination for [place].  Hearing/Vision screen No results found.  Dietary issues and exercise activities discussed: Current Exercise Habits: Home exercise routine, Type of exercise: walking, Time (Minutes): 40, Frequency (Times/Week): 3, Weekly Exercise (Minutes/Week): 120, Intensity: Mild, Exercise limited by: None identified   Goals Addressed   None    Depression Screen    10/03/2022    4:07 PM 09/19/2022    2:11 PM 06/13/2022   11:07 AM 09/15/2021    2:11 PM 01/13/2020    3:12 PM 08/29/2018    8:33 AM  PHQ 2/9 Scores  PHQ - 2 Score 0 0 0 0 0 0  PHQ- 9 Score 0 0  0      Fall Risk    10/03/2022    4:06 PM 09/19/2022    2:11 PM 06/13/2022   11:07 AM 09/15/2021     2:10 PM 07/14/2020    4:20 PM  Walker in the past year? 0 0 1 0 0  Number falls in past yr: 0 0 1 0 0  Injury with Fall? 0 0 0 0 0  Risk for fall due to : No Fall Risks No Fall Risks History of fall(s) No Fall Risks   Follow up  Falls evaluation completed Falls evaluation completed Falls evaluation completed Falls evaluation completed    [place]:  Any stairs in or around the home? Yes  If so, are there any without handrails? No  Home free of loose throw rugs in walkways, pet beds, electrical cords, etc? Yes  Adequate lighting in your home to reduce risk of falls? Yes   ASSISTIVE DEVICES UTILIZED TO PREVENT FALLS:  Life alert? No  Use of a cane, walker or w/c? No  Grab bars in the bathroom? No  Shower chair or bench in shower? Yes  Elevated toilet seat or a handicapped toilet? No     Cognitive Function:        10/03/2022    4:09 PM  6CIT Screen  What Year? 0 points  What month? 0 points  What time? 0 points  Count back from 20 0 points  Months in reverse 0 points  Repeat phrase 10 points  Total Score 10 points    Immunizations Immunization History  Administered Date(s) Administered   Influenza, High Dose Seasonal PF 04/14/2022   Influenza-Unspecified 05/08/2014, 05/07/2018, 04/28/2019, 04/26/2020, 04/07/2021   Moderna SARS-COV2 Booster Vaccination 08/20/2020   Moderna Sars-Covid-2 Vaccination 09/12/2019, 10/14/2019   PNEUMOCOCCAL CONJUGATE-20 06/13/2022   Tdap 01/16/2020   Zoster Recombinat (Shingrix) 09/27/2011    TDAP status: Up to date  Flu Vaccine status: Up  to date  Pneumococcal vaccine status: Up to date  Covid-19 vaccine status: Information provided on how to obtain vaccines.   Qualifies for Shingles Vaccine? Yes   Zostavax completed No   Shingrix Completed?: Yes  Screening Tests Health Maintenance  Topic Date Due   COVID-19 Vaccine (3 - Moderna risk series) 09/17/2020   Zoster Vaccines-  Shingrix (2 of 2) 12/19/2022 (Originally 11/22/2011)   Medicare Annual Wellness (AWV)  10/04/2023   COLONOSCOPY (Pts 45-14yr Insurance coverage will need to be confirmed)  10/21/2029   DTaP/Tdap/Td (2 - Td or Tdap) 01/15/2030   Pneumonia Vaccine 72 Years old  Completed   INFLUENZA VACCINE  Completed   DEXA SCAN  Completed   Hepatitis C Screening  Completed   HPV VACCINES  Aged Out   MAMMOGRAM  Discontinued    Health Maintenance  Health Maintenance Due  Topic Date Due   COVID-19 Vaccine (3 - Moderna risk series) 09/17/2020    Colorectal cancer screening: Type of screening: Colonoscopy. Completed 10/22/19. Repeat every 10 years  Mammogram status: No longer required due to double mastectomy.  Bone Density status: Completed 11/12/2017. Results reflect: Bone density results: OSTEOPENIA. Repeat every 2 years.  Lung Cancer Screening: (Low Dose CT Chest recommended if Age 72-80years, 30 pack-year currently smoking OR have quit w/in 15years.) does not qualify.     Additional Screening:  Hepatitis C Screening: does qualify; Completed 09/02/2018  Vision Screening: Recommended annual ophthalmology exams for early detection of glaucoma and other disorders of the eye. Is the patient up to date with their annual eye exam?  Yes  Who is the provider or what is the name of the office in which the patient attends annual eye exams? [facility] If pt is not established with a provider, would they like to be referred to a provider to establish care? No .   Dental Screening: Recommended annual dental exams for proper oral hygiene  Community Resource Referral / Chronic Care Management: CRR required this visit?  No   CCM required this visit?  No      Plan:     I have personally reviewed and noted the following in the patient's chart:   Medical and social history Use of alcohol, tobacco or illicit drugs  Current medications and supplements including opioid prescriptions. Patient is not  currently taking opioid prescriptions. Functional ability and status Nutritional status Physical activity Advanced directives List of other physicians Hospitalizations, surgeries, and ER visits in previous 12 months Vitals Screenings to include cognitive, depression, and falls Referrals and appointments  In addition, I have reviewed and discussed with patient certain preventive protocols, quality metrics, and best practice recommendations. A written personalized care plan for preventive services as well as general preventive health recommendations were provided to patient.     [place]  10/03/2022   Nurse Notes: Total time spent on the telephone with patient was 15 minutes.

## 2022-11-02 ENCOUNTER — Ambulatory Visit
Admission: RE | Admit: 2022-11-02 | Discharge: 2022-11-02 | Disposition: A | Discharge status: Home / Self Care | Payer: Medicare HMO | Source: Ambulatory Visit | Attending: Family Medicine | Admitting: Family Medicine

## 2022-11-02 DIAGNOSIS — Z780 Asymptomatic menopausal state: Secondary | ICD-10-CM | POA: Insufficient documentation

## 2022-11-02 DIAGNOSIS — M85851 Other specified disorders of bone density and structure, right thigh: Secondary | ICD-10-CM | POA: Diagnosis not present

## 2022-11-02 DIAGNOSIS — Z13820 Encounter for screening for osteoporosis: Secondary | ICD-10-CM | POA: Insufficient documentation

## 2022-11-16 DIAGNOSIS — H5213 Myopia, bilateral: Secondary | ICD-10-CM | POA: Diagnosis not present

## 2022-11-21 ENCOUNTER — Telehealth: Payer: Self-pay

## 2022-11-21 NOTE — Telephone Encounter (Signed)
Pt called back and I read the results to her and she verbalized understanding 

## 2022-11-21 NOTE — Telephone Encounter (Signed)
Results of Bone Density scan.  Note per Dr. Clent Ridges:  Osteopenia.  Continue Calcium and Vitamin D supplements, resistance training   No answer left a message to call office.

## 2022-11-30 DIAGNOSIS — D485 Neoplasm of uncertain behavior of skin: Secondary | ICD-10-CM | POA: Diagnosis not present

## 2022-11-30 DIAGNOSIS — D1801 Hemangioma of skin and subcutaneous tissue: Secondary | ICD-10-CM | POA: Diagnosis not present

## 2023-02-14 ENCOUNTER — Telehealth: Payer: Self-pay | Admitting: Cardiovascular Disease

## 2023-02-14 NOTE — Telephone Encounter (Signed)
Called patient, advised I did not see anything in the chart for a reason for the call. Patient verbalized stated the call sounded funny to her anyway, she had no questions/concerns at this time.

## 2023-02-14 NOTE — Telephone Encounter (Signed)
Patient states she is returning a call, but she doesn't know who called or what it was regarding.

## 2023-03-30 DIAGNOSIS — D2271 Melanocytic nevi of right lower limb, including hip: Secondary | ICD-10-CM | POA: Diagnosis not present

## 2023-03-30 DIAGNOSIS — D2261 Melanocytic nevi of right upper limb, including shoulder: Secondary | ICD-10-CM | POA: Diagnosis not present

## 2023-03-30 DIAGNOSIS — D225 Melanocytic nevi of trunk: Secondary | ICD-10-CM | POA: Diagnosis not present

## 2023-03-30 DIAGNOSIS — R238 Other skin changes: Secondary | ICD-10-CM | POA: Diagnosis not present

## 2023-03-30 DIAGNOSIS — Z85828 Personal history of other malignant neoplasm of skin: Secondary | ICD-10-CM | POA: Diagnosis not present

## 2023-03-30 DIAGNOSIS — D2272 Melanocytic nevi of left lower limb, including hip: Secondary | ICD-10-CM | POA: Diagnosis not present

## 2023-03-30 DIAGNOSIS — L538 Other specified erythematous conditions: Secondary | ICD-10-CM | POA: Diagnosis not present

## 2023-03-30 DIAGNOSIS — D2262 Melanocytic nevi of left upper limb, including shoulder: Secondary | ICD-10-CM | POA: Diagnosis not present

## 2023-03-30 DIAGNOSIS — B078 Other viral warts: Secondary | ICD-10-CM | POA: Diagnosis not present

## 2023-07-11 ENCOUNTER — Other Ambulatory Visit: Payer: Self-pay | Admitting: Family Medicine

## 2023-07-11 DIAGNOSIS — I10 Essential (primary) hypertension: Secondary | ICD-10-CM

## 2023-07-11 DIAGNOSIS — E039 Hypothyroidism, unspecified: Secondary | ICD-10-CM

## 2023-08-04 ENCOUNTER — Other Ambulatory Visit: Payer: Self-pay | Admitting: Family Medicine

## 2023-08-04 DIAGNOSIS — F39 Unspecified mood [affective] disorder: Secondary | ICD-10-CM

## 2023-08-04 DIAGNOSIS — E785 Hyperlipidemia, unspecified: Secondary | ICD-10-CM

## 2023-08-04 DIAGNOSIS — I10 Essential (primary) hypertension: Secondary | ICD-10-CM

## 2023-09-17 ENCOUNTER — Other Ambulatory Visit: Payer: Self-pay | Admitting: Family Medicine

## 2023-09-17 ENCOUNTER — Telehealth: Payer: Self-pay | Admitting: Family Medicine

## 2023-09-17 DIAGNOSIS — E039 Hypothyroidism, unspecified: Secondary | ICD-10-CM

## 2023-09-17 DIAGNOSIS — E782 Mixed hyperlipidemia: Secondary | ICD-10-CM

## 2023-09-17 DIAGNOSIS — E559 Vitamin D deficiency, unspecified: Secondary | ICD-10-CM

## 2023-09-17 DIAGNOSIS — I700 Atherosclerosis of aorta: Secondary | ICD-10-CM

## 2023-09-17 DIAGNOSIS — I10 Essential (primary) hypertension: Secondary | ICD-10-CM

## 2023-09-17 DIAGNOSIS — E538 Deficiency of other specified B group vitamins: Secondary | ICD-10-CM

## 2023-09-17 NOTE — Telephone Encounter (Signed)
 Patient need lab orders.

## 2023-09-24 ENCOUNTER — Other Ambulatory Visit (INDEPENDENT_AMBULATORY_CARE_PROVIDER_SITE_OTHER): Payer: Medicare HMO

## 2023-09-24 DIAGNOSIS — I700 Atherosclerosis of aorta: Secondary | ICD-10-CM | POA: Diagnosis not present

## 2023-09-24 DIAGNOSIS — E538 Deficiency of other specified B group vitamins: Secondary | ICD-10-CM | POA: Diagnosis not present

## 2023-09-24 DIAGNOSIS — I10 Essential (primary) hypertension: Secondary | ICD-10-CM | POA: Diagnosis not present

## 2023-09-24 DIAGNOSIS — E039 Hypothyroidism, unspecified: Secondary | ICD-10-CM | POA: Diagnosis not present

## 2023-09-24 DIAGNOSIS — E782 Mixed hyperlipidemia: Secondary | ICD-10-CM | POA: Diagnosis not present

## 2023-09-24 DIAGNOSIS — E559 Vitamin D deficiency, unspecified: Secondary | ICD-10-CM | POA: Diagnosis not present

## 2023-09-24 LAB — CBC WITH DIFFERENTIAL/PLATELET
Basophils Absolute: 0 10*3/uL (ref 0.0–0.1)
Basophils Relative: 0.5 % (ref 0.0–3.0)
Eosinophils Absolute: 0.2 10*3/uL (ref 0.0–0.7)
Eosinophils Relative: 4 % (ref 0.0–5.0)
HCT: 44.1 % (ref 36.0–46.0)
Hemoglobin: 15 g/dL (ref 12.0–15.0)
Lymphocytes Relative: 39.3 % (ref 12.0–46.0)
Lymphs Abs: 2.5 10*3/uL (ref 0.7–4.0)
MCHC: 33.9 g/dL (ref 30.0–36.0)
MCV: 96.7 fL (ref 78.0–100.0)
Monocytes Absolute: 0.7 10*3/uL (ref 0.1–1.0)
Monocytes Relative: 10.7 % (ref 3.0–12.0)
Neutro Abs: 2.9 10*3/uL (ref 1.4–7.7)
Neutrophils Relative %: 45.5 % (ref 43.0–77.0)
Platelets: 260 10*3/uL (ref 150.0–400.0)
RBC: 4.56 Mil/uL (ref 3.87–5.11)
RDW: 13 % (ref 11.5–15.5)
WBC: 6.3 10*3/uL (ref 4.0–10.5)

## 2023-09-24 LAB — LIPID PANEL
Cholesterol: 122 mg/dL (ref 0–200)
HDL: 47.4 mg/dL (ref >39.00)
LDL Cholesterol: 59 mg/dL (ref 0–99)
NonHDL: 74.32
Total CHOL/HDL Ratio: 3
Triglycerides: 78 mg/dL (ref 0.0–149.0)
VLDL: 15.6 mg/dL (ref 0.0–40.0)

## 2023-09-24 LAB — COMPREHENSIVE METABOLIC PANEL
ALT: 41 U/L — ABNORMAL HIGH (ref 0–35)
AST: 24 U/L (ref 0–37)
Albumin: 4.5 g/dL (ref 3.5–5.2)
Alkaline Phosphatase: 78 U/L (ref 39–117)
BUN: 9 mg/dL (ref 6–23)
CO2: 30 meq/L (ref 19–32)
Calcium: 9.6 mg/dL (ref 8.4–10.5)
Chloride: 97 meq/L (ref 96–112)
Creatinine, Ser: 0.8 mg/dL (ref 0.40–1.20)
GFR: 73.71 mL/min (ref >60.00)
Glucose, Bld: 105 mg/dL — ABNORMAL HIGH (ref 70–99)
Potassium: 3.2 meq/L — ABNORMAL LOW (ref 3.5–5.1)
Sodium: 136 meq/L (ref 135–145)
Total Bilirubin: 0.8 mg/dL (ref 0.2–1.2)
Total Protein: 7.3 g/dL (ref 6.0–8.3)

## 2023-09-24 LAB — TSH: TSH: 6.41 u[IU]/mL — ABNORMAL HIGH (ref 0.35–5.50)

## 2023-09-24 LAB — VITAMIN B12: Vitamin B-12: 506 pg/mL (ref 211–911)

## 2023-09-24 LAB — VITAMIN D 25 HYDROXY (VIT D DEFICIENCY, FRACTURES): VITD: 49.95 ng/mL (ref 30.00–100.00)

## 2023-10-01 ENCOUNTER — Encounter: Payer: Self-pay | Admitting: Family Medicine

## 2023-10-01 ENCOUNTER — Ambulatory Visit (INDEPENDENT_AMBULATORY_CARE_PROVIDER_SITE_OTHER): Payer: Medicare HMO | Admitting: Family Medicine

## 2023-10-01 VITALS — BP 114/62 | HR 86 | Temp 98.2°F | Resp 18 | Ht 66.0 in | Wt 180.5 lb

## 2023-10-01 DIAGNOSIS — Z0000 Encounter for general adult medical examination without abnormal findings: Secondary | ICD-10-CM

## 2023-10-01 DIAGNOSIS — E876 Hypokalemia: Secondary | ICD-10-CM | POA: Diagnosis not present

## 2023-10-01 DIAGNOSIS — E782 Mixed hyperlipidemia: Secondary | ICD-10-CM | POA: Diagnosis not present

## 2023-10-01 DIAGNOSIS — I10 Essential (primary) hypertension: Secondary | ICD-10-CM | POA: Diagnosis not present

## 2023-10-01 DIAGNOSIS — E039 Hypothyroidism, unspecified: Secondary | ICD-10-CM

## 2023-10-01 MED ORDER — LEVOTHYROXINE SODIUM 88 MCG PO TABS: 88.0000 ug | ORAL_TABLET | Freq: Every day | ORAL | 0 refills | Status: DC

## 2023-10-01 NOTE — Progress Notes (Signed)
 SUBJECTIVE:   Chief Complaint  Patient presents with   Annual Exam   HPI Presents for annual physical  Discussed the use of AI scribe software for clinical note transcription with the patient, who gave verbal consent to proceed.  History of Present Illness Adriana Cobb is a 73 year old female who presents for a routine physical exam.  She feels generally well and denies significant weight changes, depression, anxiety, sleep disturbances, hair loss, cold or heat intolerance, dizziness, weakness, chest pain, shortness of breath, constipation, diarrhea, and leg swelling.  She has a history of hypothyroidism and is currently taking levothyroxine 75 mcg daily, adhering to the recommended administration guidelines. There is a family history of thyroid issues. She recently experienced sinus congestion and cough, which she managed with over-the-counter medications. No significant weight changes, depression, anxiety, sleep disturbances, or hair loss. She is not experiencing cold or heat intolerance.  She has a history of hypertension and is on hydrochlorothiazide and olmesartan. Recent blood work indicated low potassium levels, potentially related to diuretic use. She consumes potassium-rich foods like bananas, nuts, sunflower seeds, and kiwi. No symptoms of dizziness, weakness, chest pain, or shortness of breath. Her blood pressure has been lower than usual, but she remains asymptomatic.  She mentions a recent emotional event involving a neighbor's death, causing some distress. She describes herself as 'always nervous' and experiences shaking, attributing it to her personality.  She is on cholesterol medication and Celexa, with sufficient refills for these medications.   Review of Systems - Negative except listed in HPI    PERTINENT PMH / PSH: As above  OBJECTIVE:  BP 114/62   Pulse 86   Temp 98.2 F (36.8 C) (Oral)   Resp 18   Ht 5\' 6"  (1.676 m)   Wt 180 lb 8 oz (81.9 kg)   SpO2  98%   BMI 29.13 kg/m    Physical Exam Vitals reviewed.  Constitutional:      General: She is not in acute distress.    Appearance: She is not ill-appearing.  HENT:     Head: Normocephalic.     Right Ear: Tympanic membrane, ear canal and external ear normal.     Left Ear: Tympanic membrane, ear canal and external ear normal.     Nose: Nose normal.     Mouth/Throat:     Mouth: Mucous membranes are moist.  Eyes:     Extraocular Movements: Extraocular movements intact.     Conjunctiva/sclera: Conjunctivae normal.     Pupils: Pupils are equal, round, and reactive to light.  Neck:     Thyroid: No thyromegaly or thyroid tenderness.     Vascular: No carotid bruit.  Cardiovascular:     Rate and Rhythm: Normal rate and regular rhythm.     Pulses: Normal pulses.     Heart sounds: Normal heart sounds.  Pulmonary:     Effort: Pulmonary effort is normal.     Breath sounds: Normal breath sounds.  Abdominal:     General: Bowel sounds are normal. There is no distension.     Palpations: Abdomen is soft.     Tenderness: There is no abdominal tenderness. There is no right CVA tenderness, left CVA tenderness, guarding or rebound.  Musculoskeletal:        General: Normal range of motion.     Cervical back: Normal range of motion.     Right lower leg: No edema.     Left lower leg: No edema.  Lymphadenopathy:  Cervical: No cervical adenopathy.  Skin:    Capillary Refill: Capillary refill takes less than 2 seconds.  Neurological:     General: No focal deficit present.     Mental Status: She is alert and oriented to person, place, and time. Mental status is at baseline.     Motor: No weakness.  Psychiatric:        Mood and Affect: Mood normal.        Behavior: Behavior normal.        Thought Content: Thought content normal.        Judgment: Judgment normal.           10/01/2023    1:28 PM 10/03/2022    4:07 PM 09/19/2022    2:11 PM 06/13/2022   11:07 AM 09/15/2021    2:11 PM   Depression screen PHQ 2/9  Decreased Interest 0 0 0 0 0  Down, Depressed, Hopeless 0 0 0 0 0  PHQ - 2 Score 0 0 0 0 0  Altered sleeping 0 0 0  0  Tired, decreased energy 0 0 0  0  Change in appetite 0 0 0  0  Feeling bad or failure about yourself  0 0 0  0  Trouble concentrating 0 0 0  0  Moving slowly or fidgety/restless 0 0 0  0  Suicidal thoughts 0 0 0  0  PHQ-9 Score 0 0 0  0  Difficult doing work/chores Not difficult at all Not difficult at all Not difficult at all  Not difficult at all      10/01/2023    1:28 PM 09/19/2022    2:11 PM 09/15/2021    2:11 PM  GAD 7 : Generalized Anxiety Score  Nervous, Anxious, on Edge 0 0 0  Control/stop worrying 0 0 0  Worry too much - different things 0 0 0  Trouble relaxing 0 0 0  Restless 0 0 0  Easily annoyed or irritable 0 0 0  Afraid - awful might happen 0 0 0  Total GAD 7 Score 0 0 0  Anxiety Difficulty Not difficult at all Not difficult at all Not difficult at all    ASSESSMENT/PLAN:  Annual physical exam Assessment & Plan: Bone density up to date Mammogram not indicated for history of bilateral breast mastectomy Colonoscopy up-to-date Tetanus up-to-date Recommend RSV vaccine Shingles vaccine up-to-date Pneumonia 20.  Vaccination completed Annual flu vaccine up-to-date   Hypokalemia Assessment & Plan: Low potassium level possibly due to diuretic use. Patient is also on a potassium-sparing medication (Benicar). Patient's diet includes potassium-rich foods. -Repeat potassium level today. -If still low, consider adding potassium supplement   Orders: -     Basic metabolic panel  Acquired hypothyroidism Assessment & Plan: Elevated TSH despite patient's adherence to levothyroxine daily. Patient has a family history of thyroid disease. No symptoms of hypothyroidism reported. -Increase levothyroxine to daily. -Repeat TSH in 6 weeks.  Orders: -     TSH; Future -     Levothyroxine Sodium; Take 1 tablet (88 mcg  total) by mouth daily.  Dispense: 60 tablet; Refill: 0  Primary hypertension Assessment & Plan: Well-controlled on Benicar and hydrochlorothiazide. No symptoms of hypotension reported. -Refill HCTZ 25 mg daily, considering decreasing in future if BP lowers -Refill Olmesartan 20 mg daily -Monitor blood pressure at home.    Mixed hyperlipidemia Assessment & Plan: On statin. No myalgia -Continue Lipitor 40 mg daily     PDMP reviewed  Return  if symptoms worsen or fail to improve, for PCP.  Dana Allan, MD

## 2023-10-01 NOTE — Patient Instructions (Addendum)
 It was a pleasure meeting you today. Thank you for allowing me to take part in your health care.  Our goals for today as we discussed include:  Stop Levothyroxine 75 mcg Start Levothyroxine 88 mcg daily Schedule follow up lab appointment in 6 weeks to repeat TSH  Potassium slightly low.  Repeat level today and if remains low will send in prescription for supplements.  Blood pressure good.  Goal <140/90 If BP <90/60 hold medications and notify MD   This is a list of the screening recommended for you and due dates:  Health Maintenance  Topic Date Due   Zoster (Shingles) Vaccine (2 of 2) 11/22/2011   COVID-19 Vaccine (4 - 2024-25 season) 04/08/2023   Medicare Annual Wellness Visit  10/04/2023   Colon Cancer Screening  10/21/2029   DTaP/Tdap/Td vaccine (2 - Td or Tdap) 01/15/2030   Pneumonia Vaccine  Completed   Flu Shot  Completed   DEXA scan (bone density measurement)  Completed   Hepatitis C Screening  Completed   HPV Vaccine  Aged Out   Mammogram  Discontinued    Follow up as needed  If you have any questions or concerns, please do not hesitate to call the office at [phone].  I look forward to our next visit and until then take care and stay safe.  Regards,   Dana Allan, MD   [facility]

## 2023-10-02 LAB — BASIC METABOLIC PANEL
BUN: 10 mg/dL (ref 6–23)
CO2: 29 meq/L (ref 19–32)
Calcium: 9.5 mg/dL (ref 8.4–10.5)
Chloride: 99 meq/L (ref 96–112)
Creatinine, Ser: 0.79 mg/dL (ref 0.40–1.20)
GFR: 74.82 mL/min (ref >60.00)
Glucose, Bld: 88 mg/dL (ref 70–99)
Potassium: 4.1 meq/L (ref 3.5–5.1)
Sodium: 139 meq/L (ref 135–145)

## 2023-10-07 ENCOUNTER — Encounter: Payer: Self-pay | Admitting: Family Medicine

## 2023-10-07 DIAGNOSIS — E876 Hypokalemia: Secondary | ICD-10-CM | POA: Insufficient documentation

## 2023-10-07 NOTE — Assessment & Plan Note (Addendum)
 Low potassium level possibly due to diuretic use. Patient is also on a potassium-sparing medication (Benicar). Patient's diet includes potassium-rich foods. -Repeat potassium level today. -If still low, consider adding potassium supplement

## 2023-10-07 NOTE — Assessment & Plan Note (Signed)
 Bone density up to date Mammogram not indicated for history of bilateral breast mastectomy Colonoscopy up-to-date Tetanus up-to-date Recommend RSV vaccine Shingles vaccine up-to-date Pneumonia 20.  Vaccination completed Annual flu vaccine up-to-date

## 2023-10-07 NOTE — Assessment & Plan Note (Signed)
 Well-controlled on Benicar and hydrochlorothiazide. No symptoms of hypotension reported. -Refill HCTZ 25 mg daily, considering decreasing in future if BP lowers -Refill Olmesartan 20 mg daily -Monitor blood pressure at home.

## 2023-10-07 NOTE — Assessment & Plan Note (Signed)
 Elevated TSH despite patient's adherence to levothyroxine daily. Patient has a family history of thyroid disease. No symptoms of hypothyroidism reported. -Increase levothyroxine to daily. -Repeat TSH in 6 weeks.

## 2023-10-07 NOTE — Assessment & Plan Note (Signed)
 On statin. No myalgia -Continue Lipitor 40 mg daily

## 2023-10-09 ENCOUNTER — Ambulatory Visit (INDEPENDENT_AMBULATORY_CARE_PROVIDER_SITE_OTHER): Payer: Medicare HMO | Admitting: *Deleted

## 2023-10-09 VITALS — Ht 66.0 in | Wt 180.0 lb

## 2023-10-09 DIAGNOSIS — Z0000 Encounter for general adult medical examination without abnormal findings: Secondary | ICD-10-CM | POA: Diagnosis not present

## 2023-10-09 NOTE — Patient Instructions (Signed)
 Ms. Adriana Cobb , Thank you for taking time to come for your Medicare Wellness Visit. I appreciate your ongoing commitment to your health goals. Please review the following plan we discussed and let me know if I can assist you in the future.   Referrals/Orders/Follow-Ups/Clinician Recommendations: Your shingles vaccines have been updated along with your flu.  This is a list of the screening recommended for you and due dates:  Health Maintenance  Topic Date Due   COVID-19 Vaccine (4 - 2024-25 season) 04/08/2023   Medicare Annual Wellness Visit  10/08/2024   Colon Cancer Screening  10/21/2029   DTaP/Tdap/Td vaccine (3 - Td or Tdap) 02/22/2033   Pneumonia Vaccine  Completed   Flu Shot  Completed   DEXA scan (bone density measurement)  Completed   Hepatitis C Screening  Completed   Zoster (Shingles) Vaccine  Completed   HPV Vaccine  Aged Out   Mammogram  Discontinued    Advanced directives: (Declined) Advance directive discussed with you today. Even though you declined this today, please call our office should you change your mind, and we can give you the proper paperwork for you to fill out.  Next Medicare Annual Wellness Visit scheduled for next year: Yes 3/9/3:40

## 2023-10-09 NOTE — Progress Notes (Signed)
 Subjective:   Adriana Cobb is a 73 y.o. who presents for a Medicare Wellness preventive visit.  Visit Complete: Virtual I connected with  Adriana Cobb on 10/09/23 by a audio enabled telemedicine application and verified that I am speaking with the correct person using two identifiers.  Patient Location: Home  Provider Location: Office/Clinic  I discussed the limitations of evaluation and management by telemedicine. The patient expressed understanding and agreed to proceed.  Vital Signs: Because this visit was a virtual/telehealth visit, some criteria may be missing or patient reported. Any vitals not documented were not able to be obtained and vitals that have been documented are patient reported.  VideoDeclined- This patient declined Librarian, academic. Therefore the visit was completed with audio only.  AWV Questionnaire: No: Patient Medicare AWV questionnaire was not completed prior to this visit.  Cardiac Risk Factors include: advanced age (>69men, >26 women);dyslipidemia;hypertension     Objective:    Today's Vitals   10/09/23 1458  Weight: 180 lb (81.6 kg)  Height: 5\' 6"  (1.676 m)   Body mass index is 29.05 kg/m.     10/09/2023    3:10 PM 10/03/2022    4:11 PM 01/29/2020   10:43 AM 10/22/2019    8:28 AM  Advanced Directives  Does Patient Have a Medical Advance Directive? No Yes No No  Type of Advance Directive  Living will    Does patient want to make changes to medical advance directive?  Yes (ED - Information included in AVS)    Would patient like information on creating a medical advance directive? No - Patient declined  Yes (MAU/Ambulatory/Procedural Areas - Information given)     Current Medications (verified) Outpatient Encounter Medications as of 10/09/2023  Medication Sig   atorvastatin (LIPITOR) 40 MG tablet TAKE 1 TABLET EVERY NIGHT DISCONTINUE ZOCOR 40 MG   Cholecalciferol (VITAMIN D PO) Take 2 tablets by mouth as needed.  2000 IU daily   citalopram (CELEXA) 20 MG tablet TAKE 1 TABLET EVERY DAY   [employer] CALCIUM PO Take by mouth.    cyanocobalamin 2000 MCG tablet Take 2,000 mcg by mouth daily.    ezetimibe (ZETIA) 10 MG tablet Take 1 tablet (10 mg total) by mouth daily.   ferrous sulfate 325 (65 FE) MG EC tablet Take 325 mg by mouth 3 (three) times daily with meals.   hydrochlorothiazide (HYDRODIURIL) 25 MG tablet TAKE 1 TABLET EVERY MORNING   levothyroxine (SYNTHROID) 88 MCG tablet Take 1 tablet (88 mcg total) by mouth daily.   olmesartan (BENICAR) 20 MG tablet TAKE 1 TABLET DAILY (APPOINTMENT FOR FURTHER REFILLS)   No facility-administered encounter medications on file as of 10/09/2023.    Allergies (verified) Enalapril and Other   History: Past Medical History:  Diagnosis Date   BRCA negative 2016   MyRisk neg   Breast cancer (HCC)    Breast neoplasm, Tis (DCIS), right 08/11/2017   Intermediate grade DCIS; ER: 90%; PR: 30 %.    Cancer (HCC) 2006   pT2,N2a,Mx; ER/PR negative, Her 2 neu: 3+ ([facility]). Dermal lymphatic involvement, 9/ 22 nodes positive with extranodal extension.  Treated with Cytoxan, Adriamycin, Taxol and Herceptin.   DCIS (ductal carcinoma in situ) right breast, ER/PR pos 09/04/2013   H/O cystitis    Hemorrhoids    Hot flashes    Hot flashes 09/02/2018   Hx of migraines    Hyperlipidemia    Hypothyroid    Neck pain 09/01/2018   Osteopenia 11/2017  [facility] at [facility]   Pain in left axilla 09/01/2018   Palpitations    Paroxysmal supraventricular tachycardia (HCC)    Paroxysmal SVT (supraventricular tachycardia) (HCC) 12/31/2012   Personal history of tobacco use, presenting hazards to health    Skin cancer    nmsc Dr. Roseanne Kaufman    Unspecified essential hypertension    Unspecified hypothyroidism    Past Surgical History:  Procedure Laterality Date   BREAST BIOPSY Left 2006   stereo breast bx   BREAST RECONSTRUCTION Bilateral 06/08/14   CESAREAN SECTION  1987, 1988   x 2    COLONOSCOPY  2007   Dr. Servando Snare   COLONOSCOPY WITH PROPOFOL N/A 10/22/2019   Procedure: COLONOSCOPY WITH PROPOFOL;  Surgeon: Earline Mayotte, MD;  Location: [facility] ENDOSCOPY;  Service: Endoscopy;  Laterality: N/A;   MASTECTOMY Left 2006   left breast   MASTECTOMY  2015   right breast    Family History  Problem Relation Age of Onset   Hypertension Mother    Breast cancer Sister    Social History   Socioeconomic History   Marital status: Married    Spouse name: Not on file   Number of children: Not on file   Years of education: Not on file   Highest education level: Not on file  Occupational History   Not on file  Tobacco Use   Smoking status: Former    Current packs/day: 0.00    Average packs/day: 1 pack/day for 10.0 years (10.0 ttl pk-yrs)    Types: Cigarettes    Start date: 09/18/1968    Quit date: 09/18/1978    Years since quitting: 45.0   Smokeless tobacco: Never  Vaping Use   Vaping status: Never Used  Substance and Sexual Activity   Alcohol use: Yes    Comment: bottle a wine weekly   Drug use: No   Sexual activity: Not Currently  Other Topics Concern   Not on file  Social History Narrative   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 [employer]   Married    No guns, wears seat belt, safe in relationship    2 kids    -son in 07/2021 1st grandson/child   Former smoker    Retired 06/2021    Social Drivers of [employer] Resource Strain: Low Risk  (10/09/2023)   Overall Financial Resource Strain (CARDIA)    Difficulty of Paying Living Expenses: Not hard at all  Food Insecurity: No Food Insecurity (10/09/2023)   Hunger Vital Sign    Worried About Running Out of Food in the Last Year: Never true    Ran Out of Food in the Last Year: Never true  Transportation Needs: No Transportation Needs (10/09/2023)   PRAPARE - Administrator, Civil Service (Medical): No    Lack of Transportation (Non-Medical): No  Physical Activity: Inactive (10/09/2023)   Exercise Vital Sign     Days of Exercise per Week: 0 days    Minutes of Exercise per Session: 0 min  Stress: No Stress Concern Present (10/09/2023)   [employer] of Occupational Health - Occupational Stress Questionnaire    Feeling of Stress : Only a little  Social Connections: Moderately Isolated (10/09/2023)   Social Connection and Isolation Panel [NHANES]    Frequency of Communication with Friends and Family: More than three times a week    Frequency of Social Gatherings with Friends and Family: More than three times a week    Attends Religious Services: Never  Active Member of Clubs or Organizations: No    Attends Banker Meetings: Never    Marital Status: Married    Tobacco Counseling Counseling given: Not Answered    Clinical Intake:  Pre-visit preparation completed: Yes  Pain : No/denies pain     BMI - recorded: 29.05 Nutritional Status: BMI 25 -29 Overweight Nutritional Risks: None Diabetes: No  How often do you need to have someone help you when you read instructions, pamphlets, or other written materials from your doctor or pharmacy?: 1 - Never  Interpreter Needed?: No  Information entered by :: R. Aloys Hupfer LPN   Activities of Daily Living     10/09/2023    3:00 PM  In your present state of health, do you have any difficulty performing the following activities:  Hearing? 0  Vision? 0  Comment glasses  Difficulty concentrating or making decisions? 0  Walking or climbing stairs? 0  Dressing or bathing? 0  Doing errands, shopping? 0  Preparing Food and eating ? N  Using the Toilet? N  In the past six months, have you accidently leaked urine? N  Do you have problems with loss of bowel control? N  Managing your Medications? N  Managing your Finances? N  Housekeeping or managing your Housekeeping? N    Patient Care Team: Dana Allan, MD as PCP - General (Family Medicine) Mariah Milling Tollie Pizza, MD as PCP - Cardiology (Cardiology) Kieth Brightly, MD (General  Surgery) Margaretann Loveless, MD as Referring Physician (Internal Medicine) Antonieta Iba, MD as Consulting Physician (Cardiology)  Indicate any recent Medical Services you may have received from other than [facility] providers in the past year (date may be approximate).     Assessment:   This is a routine wellness examination for Adri.  Hearing/Vision screen Hearing Screening - Comments:: No issues Vision Screening - Comments:: glasses   Goals Addressed             This Visit's Progress    Patient Stated       Wants to start walking on a regular basis       Depression Screen     10/09/2023    3:04 PM 10/01/2023    1:28 PM 10/03/2022    4:07 PM 09/19/2022    2:11 PM 06/13/2022   11:07 AM 09/15/2021    2:11 PM 01/13/2020    3:12 PM  PHQ 2/9 Scores  PHQ - 2 Score 0 0 0 0 0 0 0  PHQ- 9 Score 0 0 0 0  0     Fall Risk     10/09/2023    3:01 PM 10/01/2023    1:28 PM 10/03/2022    4:06 PM 09/19/2022    2:11 PM 06/13/2022   11:07 AM  Fall Risk   Falls in the past year? 0 0 0 0 1  Number falls in past yr: 0 0 0 0 1  Injury with Fall? 0 0 0 0 0  Risk for fall due to : No Fall Risks No Fall Risks No Fall Risks No Fall Risks History of fall(s)  Follow up Falls prevention discussed;Falls evaluation completed Falls evaluation completed;Education provided  Falls evaluation completed Falls evaluation completed    MEDICARE RISK AT HOME:  Medicare Risk at Home Any stairs in or around the home?: Yes If so, are there any without handrails?: No Home free of loose throw rugs in walkways, pet beds, electrical cords, etc?: Yes Adequate lighting in your home  to reduce risk of falls?: Yes Life alert?: No Use of a cane, walker or w/c?: No Grab bars in the bathroom?: Yes Shower chair or bench in shower?: Yes Elevated toilet seat or a handicapped toilet?: Yes  TIMED UP AND GO:  Was the test performed?  No  Cognitive Function: 6CIT completed        10/09/2023    3:10 PM 10/03/2022    4:09 PM   6CIT Screen  What Year? 0 points 0 points  What month? 0 points 0 points  What time? 0 points 0 points  Count back from 20 0 points 0 points  Months in reverse 0 points 0 points  Repeat phrase 0 points 10 points  Total Score 0 points 10 points    Immunizations Immunization History  Administered Date(s) Administered   Influenza, High Dose Seasonal PF 04/14/2022   Influenza-Unspecified 05/08/2014, 05/07/2018, 04/28/2019, 04/26/2020, 04/07/2021   Moderna SARS-COV2 Booster Vaccination 08/20/2020   Moderna Sars-Covid-2 Vaccination 09/12/2019, 10/14/2019   PNEUMOCOCCAL CONJUGATE-20 06/13/2022   Tdap 01/16/2020   Zoster Recombinant(Shingrix) 09/27/2011    Screening Tests Health Maintenance  Topic Date Due   Zoster Vaccines- Shingrix (2 of 2) 11/22/2011   COVID-19 Vaccine (4 - 2024-25 season) 04/08/2023   Medicare Annual Wellness (AWV)  10/04/2023   Colonoscopy  10/21/2029   DTaP/Tdap/Td (2 - Td or Tdap) 01/15/2030   Pneumonia Vaccine 33+ Years old  Completed   INFLUENZA VACCINE  Completed   DEXA SCAN  Completed   Hepatitis C Screening  Completed   HPV VACCINES  Aged Out   MAMMOGRAM  Discontinued    Health Maintenance  Health Maintenance Due  Topic Date Due   Zoster Vaccines- Shingrix (2 of 2) 11/22/2011   COVID-19 Vaccine (4 - 2024-25 season) 04/08/2023   Medicare Annual Wellness (AWV)  10/04/2023   Health Maintenance Items Addressed: See Nurse Notes  Additional Screening:  Vision Screening: Recommended annual ophthalmology exams for early detection of glaucoma and other disorders of the eye. Up to date [facility]  Dental Screening: Recommended annual dental exams for proper oral hygiene  Community Resource Referral / Chronic Care Management: CRR required this visit?  No   CCM required this visit?  No     Plan:     I have personally reviewed and noted the following in the patient's chart:   Medical and social history Use of alcohol, tobacco or illicit  drugs  Current medications and supplements including opioid prescriptions. Patient is not currently taking opioid prescriptions. Functional ability and status Nutritional status Physical activity Advanced directives List of other physicians Hospitalizations, surgeries, and ER visits in previous 12 months Vitals Screenings to include cognitive, depression, and falls Referrals and appointments  In addition, I have reviewed and discussed with patient certain preventive protocols, quality metrics, and best practice recommendations. A written personalized care plan for preventive services as well as general preventive health recommendations were provided to patient.     Sydell Axon, LPN   08/15/1476   After Visit Summary: (MyChart) Due to this being a telephonic visit, the after visit summary with patients personalized plan was offered to patient via MyChart   Notes: Nothing significant to report at this time.

## 2023-10-18 ENCOUNTER — Other Ambulatory Visit: Payer: Self-pay | Admitting: Family Medicine

## 2023-10-18 DIAGNOSIS — E785 Hyperlipidemia, unspecified: Secondary | ICD-10-CM

## 2023-10-26 ENCOUNTER — Ambulatory Visit: Payer: Medicare HMO | Attending: Medical | Admitting: Medical

## 2023-10-26 ENCOUNTER — Encounter: Payer: Self-pay | Admitting: Medical

## 2023-10-26 VITALS — BP 122/74 | HR 82 | Ht 66.0 in | Wt 180.8 lb

## 2023-10-26 DIAGNOSIS — I471 Supraventricular tachycardia, unspecified: Secondary | ICD-10-CM

## 2023-10-26 DIAGNOSIS — I700 Atherosclerosis of aorta: Secondary | ICD-10-CM | POA: Diagnosis not present

## 2023-10-26 DIAGNOSIS — E782 Mixed hyperlipidemia: Secondary | ICD-10-CM

## 2023-10-26 DIAGNOSIS — I10 Essential (primary) hypertension: Secondary | ICD-10-CM | POA: Diagnosis not present

## 2023-10-26 NOTE — Progress Notes (Signed)
  Cardiology Office Note:  .   Date:  10/26/2023  ID:  Adriana Cobb, DOB 1950/11/16, MRN [id number] PCP: Dana Allan, MD  [facility] Providers Cardiologist:  Julien Nordmann, MD {  History of Present Illness: .   Adriana Cobb is a 73 y.o. female with a hx of breast cancer on the left, mastectomy/chemotherapy/radiation treatments in 2006, SVT treated with adenosine, HLD, former smoker who presents with 12 month follow-up.    The patient had 2 episodes of SVT in 2008 treated with adenosine, initially started on amiodarone and changed to Sotalol. Due to no reoccurrence, sotalol was since discontinued.    The patient was last seen 04/2022 and was stable from a cardiac perspective.  Today, the patient reports she is overall doing well. She denies chest pain, SOB lower leg edema, heart racing, palpitations, orthopnea, or pnd. She stays active at home, but does no formal activity. She retired and has been enjoying life with grandkids.  Studies Reviewed: Marland Kitchen   EKG Interpretation Date/Time:  Friday October 26 2023 14:06:37 EDT Ventricular Rate:  82 PR Interval:  180 QRS Duration:  96 QT Interval:  372 QTC Calculation: 434 R Axis:   21  Text Interpretation: Normal sinus rhythm Normal ECG When compared with ECG of 20-Aug-2013 16:15, No significant change was found Confirmed by Fransico Michael, Keeva Reisen ([id number]) on 10/26/2023 2:10:59 PM    Echocardiogram in 2008 was essentially normal with mild MR Echocardiogram May 2009 documented moderate pulmonic regurgitation and moderate MR Echocardiogram November 2009 showed mild to moderate MR Stress test June 2000 and shows no ischemia, she exercised on treadmill for 8 minutes 30 seconds achieved 10 METS       Physical Exam:   VS:  BP 122/74 (BP Location: Left Arm, Patient Position: Sitting, Cuff Size: Normal)   Pulse 82   Ht 5\' 6"  (1.676 m)   Wt 180 lb 12.8 oz (82 kg)   SpO2 98%   BMI 29.18 kg/m    Wt Readings from Last 3 Encounters:  10/26/23  180 lb 12.8 oz (82 kg)  10/09/23 180 lb (81.6 kg)  10/01/23 180 lb 8 oz (81.9 kg)    GEN: Well nourished, well developed in no acute distress NECK: No JVD; No carotid bruits CARDIAC: RRR, no murmurs, rubs, gallops RESPIRATORY:  Clear to auscultation without rales, wheezing or rhonchi  ABDOMEN: Soft, non-tender, non-distended EXTREMITIES:  No edema; No deformity   ASSESSMENT AND PLAN: .    H/o SVT Previously on amiodarone and sotalol. She denies palpitations or heart racing. No further work-up.  HLD LDL 59. Continue Lipitor 40mg  daily and Zetia 10mg  daily.   Aortic atherosclerosis Noted on imaging in 2021. Continue ASA, Lipitor and Zetia.   HTN BP today is good. Continue hydrochlorothiazide and Benicar.       Dispo: Follow-up in 1 year  Signed, Virgal Warmuth David Stall, PA-C

## 2023-10-26 NOTE — Patient Instructions (Signed)
 Medication Instructions:  Your physician recommends that you continue on your current medications as directed. Please refer to the Current Medication list given to you today.   *If you need a refill on your cardiac medications before your next appointment, please call your pharmacy*   Lab Work: No labs ordered today    Testing/Procedures: No test ordered today    Follow-Up: At [facility], you and your health needs are our priority.  As part of our continuing mission to provide you with exceptional heart care, we have created designated Provider Care Teams.  These Care Teams include your primary Cardiologist (physician) and Advanced Practice Providers (APPs -  Physician Assistants and Nurse Practitioners) who all work together to provide you with the care you need, when you need it.  We recommend signing up for the patient portal called "MyChart".  Sign up information is provided on this After Visit Summary.  MyChart is used to connect with patients for Virtual Visits (Telemedicine).  Patients are able to view lab/test results, encounter notes, upcoming appointments, etc.  Non-urgent messages can be sent to your provider as well.   To learn more about what you can do with MyChart, go to [email or web address].    Your next appointment:   1 year(s)  Provider:   You may see Julien Nordmann, MD or one of the following Advanced Practice Providers on your designated Care Team:   Nicolasa Ducking, NP Eula Listen, PA-C Cadence Fransico Erikson, PA-C Charlsie Quest, NP Carlos Levering, NP

## 2023-11-12 ENCOUNTER — Telehealth: Payer: Self-pay

## 2023-11-12 ENCOUNTER — Other Ambulatory Visit: Payer: Medicare HMO

## 2023-11-12 NOTE — Telephone Encounter (Signed)
 Copied from CRM [id number]. Topic: Appointments - Transfer of Care >> Nov 12, 2023  9:33 AM [place] E wrote: Pt is requesting to transfer FROM: Dr. Clent Ridges Pt is requesting to transfer TO: Dr. Skip Estimable Reason for requested transfer: Dr. Clent Ridges will be leaving the practice. It is the responsibility of the team the patient would like to transfer to (Dr. Skip Estimable) to reach out to the patient if for any reason this transfer is not acceptable.

## 2023-11-19 ENCOUNTER — Other Ambulatory Visit (INDEPENDENT_AMBULATORY_CARE_PROVIDER_SITE_OTHER)

## 2023-11-19 DIAGNOSIS — E039 Hypothyroidism, unspecified: Secondary | ICD-10-CM

## 2023-11-20 ENCOUNTER — Encounter: Payer: Self-pay | Admitting: Family Medicine

## 2023-11-20 LAB — TSH: TSH: 1.37 u[IU]/mL (ref 0.35–5.50)

## 2023-11-23 ENCOUNTER — Other Ambulatory Visit: Payer: Self-pay | Admitting: Family Medicine

## 2023-11-23 DIAGNOSIS — E039 Hypothyroidism, unspecified: Secondary | ICD-10-CM

## 2024-01-03 ENCOUNTER — Other Ambulatory Visit: Payer: Self-pay

## 2024-01-03 ENCOUNTER — Encounter: Payer: Self-pay | Admitting: Family Medicine

## 2024-01-03 DIAGNOSIS — E039 Hypothyroidism, unspecified: Secondary | ICD-10-CM

## 2024-01-03 MED ORDER — LEVOTHYROXINE SODIUM 88 MCG PO TABS: 88.0000 ug | ORAL_TABLET | Freq: Every day | ORAL | 3 refills | Status: DC

## 2024-01-28 DIAGNOSIS — H5213 Myopia, bilateral: Secondary | ICD-10-CM | POA: Diagnosis not present

## 2024-04-04 DIAGNOSIS — D2262 Melanocytic nevi of left upper limb, including shoulder: Secondary | ICD-10-CM | POA: Diagnosis not present

## 2024-04-04 DIAGNOSIS — L821 Other seborrheic keratosis: Secondary | ICD-10-CM | POA: Diagnosis not present

## 2024-04-04 DIAGNOSIS — D2261 Melanocytic nevi of right upper limb, including shoulder: Secondary | ICD-10-CM | POA: Diagnosis not present

## 2024-04-04 DIAGNOSIS — L820 Inflamed seborrheic keratosis: Secondary | ICD-10-CM | POA: Diagnosis not present

## 2024-04-04 DIAGNOSIS — D2271 Melanocytic nevi of right lower limb, including hip: Secondary | ICD-10-CM | POA: Diagnosis not present

## 2024-04-04 DIAGNOSIS — Z85828 Personal history of other malignant neoplasm of skin: Secondary | ICD-10-CM | POA: Diagnosis not present

## 2024-04-04 DIAGNOSIS — D225 Melanocytic nevi of trunk: Secondary | ICD-10-CM | POA: Diagnosis not present

## 2024-04-04 DIAGNOSIS — Z08 Encounter for follow-up examination after completed treatment for malignant neoplasm: Secondary | ICD-10-CM | POA: Diagnosis not present

## 2024-04-04 DIAGNOSIS — D2272 Melanocytic nevi of left lower limb, including hip: Secondary | ICD-10-CM | POA: Diagnosis not present

## 2024-05-09 ENCOUNTER — Ambulatory Visit (INDEPENDENT_AMBULATORY_CARE_PROVIDER_SITE_OTHER)

## 2024-05-09 VITALS — BP 130/70 | HR 84 | Temp 99.1°F | Ht 66.0 in | Wt 180.6 lb

## 2024-05-09 DIAGNOSIS — E039 Hypothyroidism, unspecified: Secondary | ICD-10-CM

## 2024-05-09 DIAGNOSIS — I700 Atherosclerosis of aorta: Secondary | ICD-10-CM

## 2024-05-09 DIAGNOSIS — E785 Hyperlipidemia, unspecified: Secondary | ICD-10-CM

## 2024-05-09 DIAGNOSIS — E876 Hypokalemia: Secondary | ICD-10-CM

## 2024-05-09 DIAGNOSIS — I10 Essential (primary) hypertension: Secondary | ICD-10-CM

## 2024-05-09 DIAGNOSIS — Z23 Encounter for immunization: Secondary | ICD-10-CM | POA: Diagnosis not present

## 2024-05-09 DIAGNOSIS — F39 Unspecified mood [affective] disorder: Secondary | ICD-10-CM

## 2024-05-09 DIAGNOSIS — E782 Mixed hyperlipidemia: Secondary | ICD-10-CM

## 2024-05-09 LAB — TSH: TSH: 1.41 u[IU]/mL (ref 0.35–5.50)

## 2024-05-09 MED ORDER — HYDROCHLOROTHIAZIDE 25 MG PO TABS: 25.0000 mg | ORAL_TABLET | Freq: Every morning | ORAL | 3 refills | Status: AC

## 2024-05-09 MED ORDER — CITALOPRAM HYDROBROMIDE 20 MG PO TABS: 20.0000 mg | ORAL_TABLET | Freq: Every day | ORAL | 3 refills | Status: AC

## 2024-05-09 MED ORDER — ATORVASTATIN CALCIUM 40 MG PO TABS: 40.0000 mg | ORAL_TABLET | Freq: Every day | ORAL | 3 refills | Status: DC

## 2024-05-09 MED ORDER — LEVOTHYROXINE SODIUM 88 MCG PO TABS: 88.0000 ug | ORAL_TABLET | Freq: Every day | ORAL | 3 refills | Status: AC

## 2024-05-09 MED ORDER — EZETIMIBE 10 MG PO TABS
10.0000 mg | ORAL_TABLET | Freq: Every day | ORAL | 3 refills | Status: AC
Start: 2024-05-09 — End: ?

## 2024-05-09 MED ORDER — OLMESARTAN MEDOXOMIL 20 MG PO TABS: ORAL_TABLET | ORAL | 3 refills | Status: AC

## 2024-05-09 NOTE — Assessment & Plan Note (Addendum)
 Chronic. On Celexa  20 mg daily, continue. No SI/HI. Does have anxiety predominant which is worsened in social situations but has been stable. Recommend further evaluation if changed from baseline or bothersome to the patient. I suspect potential essential tremor.

## 2024-05-09 NOTE — Assessment & Plan Note (Signed)
 Chronic, previous LDL within goal.  On atorvastatin  40 mg at night and zetia  10 mg daily. Tolerating both well. Continue.

## 2024-05-09 NOTE — Patient Instructions (Signed)
-   Home BP goal is less than 130/80. Check BP 2-3 times a week and bring your home BP reading during your next visit.   - Diet: Emphasize whole grains, lean proteins, fruits, and vegetables. Limit processed foods and sugary drinks. Exercise: Aim for 150 minutes of moderate aerobic activity weekly plus strength training twice a week.

## 2024-05-09 NOTE — Assessment & Plan Note (Signed)
 Chronic. On Levothyroxine  88 mcg daily, without new concerns. Continue.  Medication adjustment made from 75 mcg to 88 mcg after elevated TSH on 09/24/23. Repeat TSH on 11/19/23 was normal. Check TSH today.

## 2024-05-09 NOTE — Assessment & Plan Note (Signed)
 Updated today.

## 2024-05-09 NOTE — Progress Notes (Signed)
 Established Patient Office Visit TOC from Dr. Hope    Subjective  Patient ID: Adriana Cobb, female    DOB: [birth year]  Age: 73 y.o. MRN: [id number]  Chief Complaint  Patient presents with   Establish Care    She  has a past medical history of BRCA negative (2016), Breast cancer (HCC), Breast neoplasm, Tis (DCIS), right (08/11/2017), Cancer (HCC) (2006), DCIS (ductal carcinoma in situ) right breast, ER/PR pos (09/04/2013), Diverticulitis (07/09/2019), Elevated liver enzymes (07/20/2020), H/O cystitis, Hemorrhoids, Hot flashes, Hot flashes (09/02/2018), migraines, Hyperlipidemia, Hypothyroid, Neck pain (09/01/2018), Osteopenia (11/2017), Pain in left axilla (09/01/2018), Palpitations, Paroxysmal supraventricular tachycardia, Paroxysmal SVT (supraventricular tachycardia) (12/31/2012), Personal history of tobacco use, presenting hazards to health, Skin cancer, SVT (supraventricular tachycardia), Unspecified essential hypertension, and Unspecified hypothyroidism.  HPI Patient presents for [facility] from previous PCP and chronic medication management.   - She has a h/o left  breast cancer mastectomy with reconstruction/chemotherapy/radiation in 2006.   - H/O SVT:  No new concerns, not on medications.  Previously treated with adenosine, Amiodarone and Sotalol  at different time. No new palpitations, chest pain. Last visit with cardiology was on 10/26/2023 with EKG in sinus rhythm, HR 82/min. 1 yr follow up recommended.   - Hypothyroidism: On levothyroxine  88 mcg daily. Denies symptoms of hypo or hyper thyroidism. Medication adjustment made from 75 mcg to 88 mcg after elevated TSH on 09/24/23. Repeat TSH on 11/19/23 was normal.   - HTN: No chest pain, lower leg edema. She is taking hydrochlorothiazide  25 mg, olmesartan  20 mg daily. She does not check BP at home.   - Dyslipidemia: On atorvastatin  40 mg at night and zetia  10 mg daily. Tolerating both well.   - Takes vitamin 2000 units daily, calcium   and b12 2000 mcg when she remembers but not on a daily basis. She has had normal vitamin D , calcium  and mildly low B12 back in 05/13/2022. Ets balanced diet. Drinks alcohol 1 bottle of wine/week. Sometimes she drinks 2-3 glasses of wine per day. She reports exercise could be improved.    - Has a h/o hypokalemia, with recent lab showing normal potassium. Eats potassium rich food.   - Due for influenza and COVID-19 immunization: Is interested in updating influenza today.   - Mood disorder: On Citalopram  20 mg daily with stable mood. Has anxiety specially in public places causing shakiness of hands and voice. Has been this way since she was in highschool which has not changed.   ROS As per HPI    Objective:     BP 130/70 (BP Location: Right Arm, Cuff Size: Normal)   Pulse 84   Temp 99.1 F (37.3 C) (Oral)   Ht 5' 6 (1.676 m)   Wt 180 lb 9.6 oz (81.9 kg)   SpO2 97%   BMI 29.15 kg/m      05/09/2024   11:03 AM 10/09/2023    3:04 PM 10/01/2023    1:28 PM  Depression screen PHQ 2/9  Decreased Interest 0 0 0  Down, Depressed, Hopeless 0 0 0  PHQ - 2 Score 0 0 0  Altered sleeping 0 0 0  Tired, decreased energy 0 0 0  Change in appetite 0 0 0  Feeling bad or failure about yourself  0 0 0  Trouble concentrating 0 0 0  Moving slowly or fidgety/restless 0 0 0  Suicidal thoughts 0 0 0  PHQ-9 Score 0 0 0  Difficult doing work/chores Not difficult at all Not difficult at all  Not difficult at all      05/09/2024   11:04 AM 10/01/2023    1:28 PM 09/19/2022    2:11 PM 09/15/2021    2:11 PM  GAD 7 : Generalized Anxiety Score  Nervous, Anxious, on Edge 0 0 0 0  Control/stop worrying 0 0 0 0  Worry too much - different things 0 0 0 0  Trouble relaxing 0 0 0 0  Restless 0 0 0 0  Easily annoyed or irritable 0 0 0 0  Afraid - awful might happen 0 0 0 0  Total GAD 7 Score 0 0 0 0  Anxiety Difficulty Not difficult at all Not difficult at all Not difficult at all Not difficult at all       05/09/2024   11:03 AM 10/09/2023    3:04 PM 10/01/2023    1:28 PM  Depression screen PHQ 2/9  Decreased Interest 0 0 0  Down, Depressed, Hopeless 0 0 0  PHQ - 2 Score 0 0 0  Altered sleeping 0 0 0  Tired, decreased energy 0 0 0  Change in appetite 0 0 0  Feeling bad or failure about yourself  0 0 0  Trouble concentrating 0 0 0  Moving slowly or fidgety/restless 0 0 0  Suicidal thoughts 0 0 0  PHQ-9 Score 0 0 0  Difficult doing work/chores Not difficult at all Not difficult at all Not difficult at all      05/09/2024   11:04 AM 10/01/2023    1:28 PM 09/19/2022    2:11 PM 09/15/2021    2:11 PM  GAD 7 : Generalized Anxiety Score  Nervous, Anxious, on Edge 0 0 0 0  Control/stop worrying 0 0 0 0  Worry too much - different things 0 0 0 0  Trouble relaxing 0 0 0 0  Restless 0 0 0 0  Easily annoyed or irritable 0 0 0 0  Afraid - awful might happen 0 0 0 0  Total GAD 7 Score 0 0 0 0  Anxiety Difficulty Not difficult at all Not difficult at all Not difficult at all Not difficult at all   SDOH Screenings   Food Insecurity: No Food Insecurity (10/09/2023)  Housing: Unknown (10/09/2023)  Transportation Needs: No Transportation Needs (10/09/2023)  Utilities: Not At Risk (10/09/2023)  Alcohol Screen: Low Risk  (10/09/2023)  Depression (PHQ2-9): Low Risk  (05/09/2024)  Financial Resource Strain: Low Risk  (10/09/2023)  Physical Activity: Inactive (10/09/2023)  Social Connections: Moderately Isolated (10/09/2023)  Stress: No Stress Concern Present (10/09/2023)  Tobacco Use: Medium Risk (05/09/2024)  Health Literacy: Adequate Health Literacy (10/09/2023)     Physical Exam Constitutional:      General: She is not in acute distress.    Appearance: Normal appearance.  HENT:     Head: Normocephalic and atraumatic.     Right Ear: Tympanic membrane normal.     Left Ear: Tympanic membrane normal.     Mouth/Throat:     Mouth: Mucous membranes are moist.  Neck:     Thyroid : No thyroid  mass or thyroid   tenderness.  Cardiovascular:     Rate and Rhythm: Normal rate and regular rhythm.  Pulmonary:     Effort: Pulmonary effort is normal.     Breath sounds: Normal breath sounds.  Abdominal:     General: Bowel sounds are normal.     Palpations: Abdomen is soft.     Tenderness: There is no guarding.  Musculoskeletal:  Cervical back: Neck supple. No rigidity.     Right lower leg: No edema.     Left lower leg: No edema.  Skin:    General: Skin is warm.  Neurological:     Mental Status: She is alert and oriented to person, place, and time.  Psychiatric:        Mood and Affect: Mood normal.        Behavior: Behavior normal.        No results found for any visits on 05/09/24.  The ASCVD Risk score (Arnett DK, et al., 2019) failed to calculate for the following reasons:   The valid total cholesterol range is 130 to 320 mg/dL     Assessment & Plan:    Needs flu shot -     Flu vaccine HIGH DOSE PF(Fluzone Trivalent)  Mood disorder Assessment & Plan: Chronic. On Celexa  20 mg daily, continue. No SI/HI. Does have anxiety predominant which is worsened in social situations but has been stable. Recommend further evaluation if changed from baseline or bothersome to the patient. I suspect potential essential tremor.    Orders: -     Citalopram  Hydrobromide; Take 1 tablet (20 mg total) by mouth daily.  Dispense: 90 tablet; Refill: 3  Primary hypertension Assessment & Plan: Elevated BP on arrival, repeat BP within goal.  Continue hydrochlorothiazide  25 mg, olmesartan  20 mg daily. Refill sent.  DASH diet information provided.  Repeat CMP during f/u visit.  Also counsel patient on reducing alcohol consumption to no more than 1 drink per sitting, regular exercise.   Check blood pressure 2-3 times a week and bring readings to next visit in 3 months.  Consider medication adjustment if home readings are consistently above goal of <130/80 mmHg.   Orders: -     hydroCHLOROthiazide ;  Take 1 tablet (25 mg total) by mouth every morning.  Dispense: 90 tablet; Refill: 3 -     Olmesartan  Medoxomil; TAKE 1 TABLET DAILY  Dispense: 90 tablet; Refill: 3  Acquired hypothyroidism Assessment & Plan: Chronic. On Levothyroxine  88 mcg daily, without new concerns. Continue.  Medication adjustment made from 75 mcg to 88 mcg after elevated TSH on 09/24/23. Repeat TSH on 11/19/23 was normal. Check TSH today.   Orders: -     Levothyroxine  Sodium; Take 1 tablet (88 mcg total) by mouth daily.  Dispense: 90 tablet; Refill: 3 -     TSH  Mixed hyperlipidemia Assessment & Plan: Chronic, previous LDL within goal.  On atorvastatin  40 mg at night and zetia  10 mg daily. Tolerating both well. Continue.    Orders: -     Atorvastatin  Calcium ; Take 1 tablet (40 mg total) by mouth daily.  Dispense: 90 tablet; Refill: 3 -     Ezetimibe ; Take 1 tablet (10 mg total) by mouth daily.  Dispense: 90 tablet; Refill: 3 -     Lipid panel; Future -     Comprehensive metabolic panel with GFR; Future    Return in about 3 months (around 08/09/2024) for BP, mood follow up. .   Mickie Badders, MD

## 2024-05-09 NOTE — Assessment & Plan Note (Addendum)
 Elevated BP on arrival, repeat BP within goal.  Continue hydrochlorothiazide  25 mg, olmesartan  20 mg daily. Refill sent.  DASH diet information provided.  Repeat CMP during f/u visit.  Also counsel patient on reducing alcohol consumption to no more than 1 drink per sitting, regular exercise.   Check blood pressure 2-3 times a week and bring readings to next visit in 3 months.  Consider medication adjustment if home readings are consistently above goal of <130/80 mmHg.

## 2024-05-11 ENCOUNTER — Ambulatory Visit: Payer: Self-pay

## 2024-05-11 DIAGNOSIS — E782 Mixed hyperlipidemia: Secondary | ICD-10-CM

## 2024-05-11 DIAGNOSIS — I700 Atherosclerosis of aorta: Secondary | ICD-10-CM

## 2024-06-09 MED ORDER — ATORVASTATIN CALCIUM 40 MG PO TABS: 40.0000 mg | ORAL_TABLET | Freq: Every day | ORAL | 3 refills | Status: AC

## 2024-06-09 NOTE — Telephone Encounter (Signed)
 I have sent a new refill on Atorvastatin  40 mg, daily on 06/09/24. If she gets a letter from [facility] despite new refill stating problem with prescription we will reach out to  [facility]. Usually insurance company updates prescribes as well on rejections with prescription.  So far I have not gotten rejection from the insurance.   1. Aortic atherosclerosis (Primary) - atorvastatin  (LIPITOR) 40 MG tablet; Take 1 tablet (40 mg total) by mouth daily.  Dispense: 90 tablet; Refill: 3  2. Mixed hyperlipidemia - Goal LDL <70 - atorvastatin  (LIPITOR) 40 MG tablet; Take 1 tablet (40 mg total) by mouth daily.  Dispense: 90 tablet; Refill: 3  Hendrix Yurkovich, MD

## 2024-09-02 ENCOUNTER — Ambulatory Visit

## 2024-09-12 ENCOUNTER — Ambulatory Visit

## 2024-10-08 ENCOUNTER — Ambulatory Visit

## 2024-10-13 ENCOUNTER — Ambulatory Visit
